# Patient Record
Sex: Male | Born: 1955 | Race: White | Hispanic: No | Smoking: Former smoker
Health system: Southern US, Community
[De-identification: ages and names within clinical notes are randomized; demographics above are authoritative.]

## PROBLEM LIST (undated history)

## (undated) DIAGNOSIS — K219 Gastro-esophageal reflux disease without esophagitis: Secondary | ICD-10-CM

## (undated) DIAGNOSIS — R0600 Dyspnea, unspecified: Secondary | ICD-10-CM

## (undated) DIAGNOSIS — I739 Peripheral vascular disease, unspecified: Secondary | ICD-10-CM

## (undated) DIAGNOSIS — I10 Essential (primary) hypertension: Secondary | ICD-10-CM

## (undated) DIAGNOSIS — J45909 Unspecified asthma, uncomplicated: Secondary | ICD-10-CM

## (undated) DIAGNOSIS — R071 Chest pain on breathing: Secondary | ICD-10-CM

## (undated) HISTORY — PX: HERNIA REPAIR: SHX51

## (undated) HISTORY — DX: Gastro-esophageal reflux disease without esophagitis: K21.9

## (undated) HISTORY — DX: Essential (primary) hypertension: I10

---

## 2002-02-15 DIAGNOSIS — R0789 Other chest pain: Secondary | ICD-10-CM

## 2002-02-15 HISTORY — DX: Other chest pain: R07.89

## 2004-02-29 ENCOUNTER — Emergency Department (HOSPITAL_COMMUNITY): Admission: EM | Admit: 2004-02-29 | Discharge: 2004-02-29 | Payer: Self-pay | Admitting: Emergency Medicine

## 2004-12-05 ENCOUNTER — Ambulatory Visit: Payer: Self-pay | Admitting: Internal Medicine

## 2005-03-15 ENCOUNTER — Emergency Department (HOSPITAL_COMMUNITY): Admission: EM | Admit: 2005-03-15 | Discharge: 2005-03-15 | Payer: Self-pay | Admitting: Family Medicine

## 2005-09-21 ENCOUNTER — Ambulatory Visit: Payer: Self-pay | Admitting: Internal Medicine

## 2005-12-07 ENCOUNTER — Ambulatory Visit: Payer: Self-pay | Admitting: Internal Medicine

## 2005-12-08 ENCOUNTER — Ambulatory Visit: Payer: Self-pay | Admitting: Internal Medicine

## 2006-04-26 ENCOUNTER — Ambulatory Visit: Payer: Self-pay | Admitting: Internal Medicine

## 2006-09-27 ENCOUNTER — Ambulatory Visit: Payer: Self-pay | Admitting: Internal Medicine

## 2006-10-14 ENCOUNTER — Ambulatory Visit: Payer: Self-pay | Admitting: Internal Medicine

## 2006-10-18 ENCOUNTER — Encounter: Admission: RE | Admit: 2006-10-18 | Discharge: 2006-10-18 | Payer: Self-pay | Admitting: General Surgery

## 2006-10-20 ENCOUNTER — Ambulatory Visit (HOSPITAL_BASED_OUTPATIENT_CLINIC_OR_DEPARTMENT_OTHER): Admission: RE | Admit: 2006-10-20 | Discharge: 2006-10-20 | Payer: Self-pay | Admitting: General Surgery

## 2008-01-25 ENCOUNTER — Ambulatory Visit: Payer: Self-pay | Admitting: Family Medicine

## 2008-01-25 DIAGNOSIS — J111 Influenza due to unidentified influenza virus with other respiratory manifestations: Secondary | ICD-10-CM

## 2008-01-30 ENCOUNTER — Encounter: Payer: Self-pay | Admitting: Internal Medicine

## 2008-01-30 ENCOUNTER — Ambulatory Visit: Payer: Self-pay | Admitting: Internal Medicine

## 2009-05-19 ENCOUNTER — Emergency Department (HOSPITAL_COMMUNITY): Admission: EM | Admit: 2009-05-19 | Discharge: 2009-05-19 | Payer: Self-pay | Admitting: Family Medicine

## 2010-06-17 NOTE — Assessment & Plan Note (Signed)
Summary: continued fever and cough/dm    History of Present Illness: 55 year old gentleman, who was seen 5 days ago with chest congestion and cough.  He has failed to improve with persistent cough and has developed posterior back pain.  he has no fever, and cough remains largely nonproductive.  Back pain is aggravated by the coughing    Current Allergies: No known allergies       Physical Exam  General:     alert.  no acute distress, but does have some mild resting tachypnea, and frequent paroxysms of coughing Head:     Normocephalic and atraumatic without obvious abnormalities. No apparent alopecia or balding. Eyes:     No corneal or conjunctival inflammation noted. EOMI. Perrla. Funduscopic exam benign, without hemorrhages, exudates or papilledema. Vision grossly normal. Ears:     External ear exam shows no significant lesions or deformities.  Otoscopic examination reveals clear canals, tympanic membranes are intact bilaterally without bulging, retraction, inflammation or discharge. Hearing is grossly normal bilaterally. Mouth:     Oral mucosa and oropharynx without lesions or exudates.  Teeth in good repair. Neck:     No deformities, masses, or tenderness noted. Lungs:     few rhonchi noted, especially the upper lungs; faint wheezing also noted; O2 saturation 99% Heart:     regular rhythm. S1 and S2 normal without gallop, murmur, click, rub or other extra sounds.  pulse rate 110 Abdomen:     Bowel sounds positive,abdomen soft and non-tender without masses, organomegaly or hernias noted.    Impression & Recommendations:  Problem # 1:  BRONCHITIS-ACUTE (ICD-466.0)  His updated medication list for this problem includes:    Hydrocodone-homatropine 5-1.5 Mg/30ml Syrp (Hydrocodone-homatropine) .Marland Kitchen... 2 tsp q4h as needed cough    Mucinex D [phone] Mg Xr12h-tab (Pseudoephedrine-guaifenesin) .Marland Kitchen... 1 two times a day for congestion    Doxycycline Hyclate 100 Mg Caps (Doxycycline  hyclate) ..... One twice daily patient has acute asthmatic bronchitis.  Will place on inhalational bronchodilators,  check a chest x-ray and placed on doxycycline 1 mg b.i.d. Orders: T-2 View CXR, Same Day ([id number])   Complete Medication List: 1)  Lorcet 10/650 10-650 Mg Tabs (Hydrocodone-acetaminophen) .Marland Kitchen.. 1 q4h as needed achin or pain 2)  Hydrocodone-homatropine 5-1.5 Mg/9ml Syrp (Hydrocodone-homatropine) .... 2 tsp q4h as needed cough 3)  Mucinex D [phone] Mg Xr12h-tab (Pseudoephedrine-guaifenesin) .Marland Kitchen.. 1 two times a day for congestion 4)  Doxycycline Hyclate 100 Mg Caps (Doxycycline hyclate) .... One twice daily   Patient Instructions: 1)  Drink as much fluid as you can tolerate for the next few days. 2)  chest x-ray, as scheduled   Prescriptions: DOXYCYCLINE HYCLATE 100 MG CAPS (DOXYCYCLINE HYCLATE) one twice daily  #14 x 0   Entered and Authorized by:   Gordy Savers  MD   Signed by:   Gordy Savers  MD on 01/30/2008   Method used:   Print then Give to Patient   RxID:   [id number]  ]

## 2010-09-30 NOTE — Op Note (Signed)
Martin, Soto NO.:  [id number]   MEDICAL RECORD NO.:  [id number]          PATIENT TYPE:  AMB   LOCATION:  [facility]                         FACILITY:  [facility]   PHYSICIAN:  Anselm Pancoast. Weatherly, M.D.DATE OF BIRTH:  [birth year]   DATE OF PROCEDURE:  10/20/2006  DATE OF DISCHARGE:                               OPERATIVE REPORT   PREOPERATIVE DIAGNOSIS:  Left inguinal hernia, probably direct.   POSTOPERATIVE DIAGNOSIS:  Left inguinal hernia, direct.   OPERATION:  Left inguinal herniorrhaphy.   ANESTHESIA:  General, with local applied to the wound.   SURGEON:  Anselm Pancoast. Zachery Dakins, M.D.   HISTORY:  The patient is a 55 year old male who I saw in the office  recently, when he presented with a left inguinal hernia.  His past  history is significant in that Dr. Samuella Cota repaired a right inguinal  hernia, I think it was in 2001 or 2002.  This patient has an obvious  bulge and I recommend that we repair this with mesh reinforcements  similar to what Dr. Samuella Cota had done on the right.  He is a cigarette  smoker and we did an EKG, which is unremarkable.  He said he was maybe  allergic to penicillin.  I gave him a gram of Ancef since he thinks he  can take the Kefzol, and he tolerated this without any problems.  The  patient was taken back to the operative suite.  The left side had been  marked and, after induction of general anesthesia and an LOA tube, the  left groin was first clipped and then prepped with Betadine solution and  draped in a sterile manner.  The inguinal incision area similar to Dr.  Kandice Hams side was infiltrated with a mixture of 0.5% Marcaine and 0.25%  Xylocaine with adrenaline, and the ilioinguinal nerve area was  infiltrated with a blunted 22-gauge needle, all total about 30 mL of  solution used.  Sharp dissection down through the skin, subcutaneous  tissue, and 2 large veins were clamped, divided, and ligated with fine  Vicryl, and then the external  oblique aponeurosis was opened through the  external ring.  The ilioinguinal nerve was identified and protected, and  then elevated, all structures with a Penrose.  This is a direct hernia  that is protruding out, and I reduced it, freed up the areolar tissue  around it, and then repaired the defect with a common modified shouldice  starting at the pubis and then closing the defect with a running 2-0  Prolene, and then going back and tying the two ends together with  running suture.  The Prolene mesh shaped like a sail was slipped and  placed under the cord structures.  The ilioinguinal nerve comes out with  the cord structures, and then I sutured it to the inguinal ligament with  a running 2-0 Prolene.  The two tails were sutured together laterally  and I was taking care not to compromise the new ring.  The superior flap  was sutured down with interrupted sutures of 2-0 Prolene.  The mesh was  lying flat  under no excessive tension.  I placed a few more mL of  Marcaine where I had repaired the floor, and then also put a little more  in the subcutaneous wound area.  The Scarpa fascia was closed with a  running 2-0 Vicryl.  The external oblique was closed with a running 2-0  Vicryl.  The Scarpa fascia was closed with an interrupted 3-0 Vicryl,  and then  a 4-0 Dexon subcuticular and benzoin and Steri-Strips on the skin.  The  testicle was in its normal position, and a sterile occlusive dressing  was applied.  The patient will be released after a short stay in the  recovery room, have Tylox for incisional pain, and I will see him in the  office in approximately 10 days.           ______________________________  Anselm Pancoast. Zachery Dakins, M.D.     WJW/MEDQ  D:  10/20/2006  T:  10/20/2006  Job:  [id number]   cc:   Gordy Savers, MD  [address] [place]  [place] [address]   Medical Chart

## 2011-03-05 LAB — COMPREHENSIVE METABOLIC PANEL
ALT: 24
AST: 21
Albumin: 4
Alkaline Phosphatase: 73
BUN: 9
CO2: 22
Calcium: 9.1
Chloride: 105
Creatinine, Ser: 0.83
GFR calc Af Amer: >60
GFR calc non Af Amer: >60
Glucose, Bld: 95
Potassium: 4
Sodium: 137
Total Bilirubin: 0.9
Total Protein: 6.1

## 2011-03-05 LAB — DIFFERENTIAL
Basophils Absolute: 0.1
Basophils Relative: 1
Eosinophils Absolute: 0.1
Eosinophils Relative: 1
Lymphocytes Relative: 43
Lymphs Abs: 5.4 — ABNORMAL HIGH
Monocytes Absolute: 0.5
Monocytes Relative: 4
Neutro Abs: 6.5
Neutrophils Relative %: 52

## 2011-03-05 LAB — CBC
HCT: 46.2
Hemoglobin: 16.2
MCHC: 35
MCV: 92.7
Platelets: 195
RBC: 4.98
RDW: 12.9
WBC: 12.5 — ABNORMAL HIGH

## 2011-05-10 ENCOUNTER — Emergency Department (INDEPENDENT_AMBULATORY_CARE_PROVIDER_SITE_OTHER)
Admission: EM | Admit: 2011-05-10 | Discharge: 2011-05-10 | Disposition: A | Discharge status: Home / Self Care | Payer: BC Managed Care – PPO | Source: Home / Self Care | Attending: Family Medicine | Admitting: Family Medicine

## 2011-05-10 ENCOUNTER — Emergency Department (INDEPENDENT_AMBULATORY_CARE_PROVIDER_SITE_OTHER): Payer: BC Managed Care – PPO

## 2011-05-10 DIAGNOSIS — R6889 Other general symptoms and signs: Secondary | ICD-10-CM

## 2011-05-10 DIAGNOSIS — J111 Influenza due to unidentified influenza virus with other respiratory manifestations: Secondary | ICD-10-CM

## 2011-05-10 MED ORDER — HYDROCOD POLST-CHLORPHEN POLST 10-8 MG/5ML PO LQCR: 5.0000 mL | Freq: Two times a day (BID) | ORAL | Status: DC | PRN

## 2011-05-10 NOTE — ED Notes (Signed)
Patient complains of cough, chills, sore throat, headache and bilateral rib pain when coughing.  Patient states symptoms started yesterday am.

## 2011-05-10 NOTE — ED Provider Notes (Signed)
History     CSN: [id number]  Arrival date & time 05/10/11  1518   First MD Initiated Contact with Patient 05/10/11 1538      Chief Complaint  Patient presents with  . Cough  . Chills    (Consider location/radiation/quality/duration/timing/severity/associated sxs/prior treatment) HPI Comments: Martin Soto presents for evaluation of sudden onset of body aches, persistent non-productive cough, rib pain with cough.  Patient is a 55 y.o. male presenting with cough. The history is provided by the patient.  Cough This is a new problem. The current episode started yesterday. The problem occurs constantly. The problem has not changed since onset.The cough is non-productive. The maximum temperature recorded prior to his arrival was 101 to 101.9 F. Associated symptoms include chest pain, chills, rhinorrhea, sore throat, myalgias and shortness of breath. He is a smoker.    History reviewed. No pertinent past medical history.  Past Surgical History  Procedure Date  . Hernia repair     History reviewed. No pertinent family history.  History  Substance Use Topics  . Smoking status: Current Everyday Smoker -- 1.0 packs/day    Types: Cigarettes  . Smokeless tobacco: Not on file  . Alcohol Use: Yes     social      Review of Systems  Constitutional: Positive for fever and chills.  HENT: Positive for congestion, sore throat and rhinorrhea.   Eyes: Negative.   Respiratory: Positive for cough and shortness of breath.   Cardiovascular: Positive for chest pain.  Gastrointestinal: Negative.   Genitourinary: Negative.   Musculoskeletal: Positive for myalgias.  Skin: Negative.   Neurological: Negative.     Allergies  Penicillins  Home Medications   Current Outpatient Rx  Name Route Sig Dispense Refill  . HYDROCOD POLST-CHLORPHEN POLST 10-8 MG/5ML PO LQCR Oral Take 5 mLs by mouth every 12 (twelve) hours as needed (May take 5 ml to 10 ml every 12 hours as needed). 140 mL 0    BP 128/84   Pulse 99  Temp(Src) 99.1 F (37.3 C) (Oral)  Resp 20  SpO2 100%  Physical Exam  Constitutional: He is oriented to person, place, and time. He appears well-developed and well-nourished.  HENT:  Head: Normocephalic and atraumatic.  Right Ear: Tympanic membrane and external ear normal.  Left Ear: Tympanic membrane and external ear normal.  Mouth/Throat: Uvula is midline, oropharynx is clear and moist and mucous membranes are normal. No oropharyngeal exudate, posterior oropharyngeal edema or posterior oropharyngeal erythema.  Eyes: Conjunctivae and EOM are normal. Pupils are equal, round, and reactive to light.  Neck: Normal range of motion. Neck supple.  Cardiovascular: Normal rate and regular rhythm.   Pulmonary/Chest: Effort normal and breath sounds normal. He has no wheezes. He has no rhonchi. He has no rales.  Neurological: He is alert and oriented to person, place, and time.  Skin: Skin is warm and dry.    ED Course  Procedures (including critical care time)  Labs Reviewed - No data to display Dg Chest 2 View  05/10/2011  *RADIOLOGY REPORT*  Clinical Data: Cough and fever.  CHEST - 2 VIEW  Comparison: Plain films of the chest 01/30/2008.  Findings: No focal airspace disease or effusion is identified. There is some peribronchial thickening which is stable in appearance.  Heart size is upper normal.  IMPRESSION: Chronic bronchitic change.  No acute finding.  Original Report Authenticated By: Bernadene Bell. D'ALESSIO, M.D.     1. Influenza-like illness       MDM  Given  rx for Tussionex Pennkinetic cough syrup; supportive care        Richardo Priest, MD 05/10/11 1731

## 2013-03-21 ENCOUNTER — Telehealth: Payer: Self-pay | Admitting: Internal Medicine

## 2013-03-21 NOTE — Telephone Encounter (Signed)
Patient Information:  Caller Name: Edwar  Phone: [phone]  Patient: Martin Soto, Martin Soto  Gender: Male  DOB: Jun 20, 1955  Age: 57 Years  PCP: Eleonore Chiquito (Family Practice > 57yrs old)  Office Follow Up:  Does the office need to follow up with this patient?: Yes  Instructions For The Office: appt to re-establish care krs/can  RN Note:  States went on vacation with a group last week, which included an RN who "checked everybody's blood pressure twice a day."  States she kept telling him that his heart rate was very high, and his blood pressure was "a little high," and he should have it checked out.  States he "has been hyper all my life."  HR 92-113 during relaxed state.  States BP was 150's/100's like 159/108.  States he plays golf regularly and feels well.  Per high blood pressure protocol, emergent symptoms currently denied; advised appt within 2 weeks.  Appt scheduled 03/22/13 0915.  Patient states he has not been seen in office for > 3 years; TC to office.  Per staff request, caller advised he is considered a new patient due to not being seen > 3 years; appt cancelled in Epic.  Info to office for staff/provider review/callback to schedule appt to re-establish care.  krs/can  Symptoms  Reason For Call & Symptoms: high blood pressure  Reviewed Health History In EMR: Yes  Reviewed Medications In EMR: Yes  Reviewed Allergies In EMR: Yes  Reviewed Surgeries / Procedures: Yes  Date of Onset of Symptoms: 03/21/2013  Guideline(s) Used:  High Blood Pressure  Disposition Per Guideline:   See Within 2 Weeks in Office  Reason For Disposition Reached:   BP > 140/90 and is not taking BP medications  Advice Given:  N/A  Patient Will Follow Care Advice:  YES

## 2013-03-21 NOTE — Telephone Encounter (Signed)
Please call pt and schedule an appt regarding blood pressure.

## 2013-03-21 NOTE — Telephone Encounter (Signed)
Pt is sch for tomorrow °

## 2013-03-22 ENCOUNTER — Encounter: Payer: Self-pay | Admitting: Internal Medicine

## 2013-03-22 ENCOUNTER — Ambulatory Visit (INDEPENDENT_AMBULATORY_CARE_PROVIDER_SITE_OTHER): Payer: 59 | Admitting: Internal Medicine

## 2013-03-22 ENCOUNTER — Ambulatory Visit: Payer: Self-pay | Admitting: Internal Medicine

## 2013-03-22 VITALS — BP 150/90 | HR 98 | Temp 98.3°F | Resp 20 | Wt 162.0 lb

## 2013-03-22 DIAGNOSIS — I10 Essential (primary) hypertension: Secondary | ICD-10-CM

## 2013-03-22 MED ORDER — LISINOPRIL-HYDROCHLOROTHIAZIDE 20-25 MG PO TABS: 1.0000 | ORAL_TABLET | Freq: Every day | ORAL | Status: DC

## 2013-03-22 NOTE — Patient Instructions (Signed)
Limit your sodium (Salt) intake  Please check your blood pressure on a regular basis.  If it is consistently greater than 150/90, please make an office appointment.    It is important that you exercise regularly, at least 20 minutes 3 to 4 times per week.  If you develop chest pain or shortness of breath seek  medical attention.DASH Diet The DASH diet stands for "Dietary Approaches to Stop Hypertension." It is a healthy eating plan that has been shown to reduce high blood pressure (hypertension) in as little as 14 days, while also possibly providing other significant health benefits. These other health benefits include reducing the risk of breast cancer after menopause and reducing the risk of type 2 diabetes, heart disease, colon cancer, and stroke. Health benefits also include weight loss and slowing kidney failure in patients with chronic kidney disease.  DIET GUIDELINES  Limit salt (sodium). Your diet should contain less than 1500 mg of sodium daily.  Limit refined or processed carbohydrates. Your diet should include mostly whole grains. Desserts and added sugars should be used sparingly.  Include small amounts of heart-healthy fats. These types of fats include nuts, oils, and tub margarine. Limit saturated and trans fats. These fats have been shown to be harmful in the body. CHOOSING FOODS  The following food groups are based on a 2000 calorie diet. See your Registered Dietitian for individual calorie needs. Grains and Grain Products (6 to 8 servings daily)  Eat More Often: Whole-wheat bread, brown rice, whole-grain or wheat pasta, quinoa, popcorn without added fat or salt (air popped).  Eat Less Often: White bread, white pasta, white rice, cornbread. Vegetables (4 to 5 servings daily)  Eat More Often: Fresh, frozen, and canned vegetables. Vegetables may be raw, steamed, roasted, or grilled with a minimal amount of fat.  Eat Less Often/Avoid: Creamed or fried vegetables. Vegetables in a  cheese sauce. Fruit (4 to 5 servings daily)  Eat More Often: All fresh, canned (in natural juice), or frozen fruits. Dried fruits without added sugar. One hundred percent fruit juice ( cup [237 mL] daily).  Eat Less Often: Dried fruits with added sugar. Canned fruit in light or heavy syrup. [employer], Fish, and Poultry (2 servings or less daily. One serving is 3 to 4 oz [85-114 g]).  Eat More Often: Ninety percent or leaner ground beef, tenderloin, sirloin. Round cuts of beef, chicken breast, [place] breast. All fish. Grill, bake, or broil your meat. Nothing should be fried.  Eat Less Often/Avoid: Fatty cuts of meat, [place], or chicken leg, thigh, or wing. Fried cuts of meat or fish. Dairy (2 to 3 servings)  Eat More Often: Low-fat or fat-free milk, low-fat plain or light yogurt, reduced-fat or part-skim cheese.  Eat Less Often/Avoid: Milk (whole, 2%).Whole milk yogurt. Full-fat cheeses. Nuts, Seeds, and Legumes (4 to 5 servings per week)  Eat More Often: All without added salt.  Eat Less Often/Avoid: Salted nuts and seeds, canned beans with added salt. Fats and Sweets (limited)  Eat More Often: Vegetable oils, tub margarines without trans fats, sugar-free gelatin. Mayonnaise and salad dressings.  Eat Less Often/Avoid: Coconut oils, palm oils, butter, stick margarine, cream, half and half, cookies, candy, pie. FOR MORE INFORMATION The Dash Diet Eating Plan: www.dashdiet.org Document Released: 04/23/2011 Document Revised: 07/27/2011 Document Reviewed: 04/23/2011 [facility] Patient Information 2014 [place], [place]. Hypertension As your heart beats, it forces blood through your arteries. This force is your blood pressure. If the pressure is too high, it is called hypertension (  HTN) or high blood pressure. HTN is dangerous because you may have it and not know it. High blood pressure may mean that your heart has to work harder to pump blood. Your arteries may be narrow or stiff. The extra  work puts you at risk for heart disease, stroke, and other problems.  Blood pressure consists of two numbers, a higher number over a lower, 110/72, for example. It is stated as "110 over 72." The ideal is below 120 for the top number (systolic) and under 80 for the bottom (diastolic). Write down your blood pressure today. You should pay close attention to your blood pressure if you have certain conditions such as:  Heart failure.  Prior heart attack.  Diabetes  Chronic kidney disease.  Prior stroke.  Multiple risk factors for heart disease. To see if you have HTN, your blood pressure should be measured while you are seated with your arm held at the level of the heart. It should be measured at least twice. A one-time elevated blood pressure reading (especially in the Emergency Department) does not mean that you need treatment. There may be conditions in which the blood pressure is different between your right and left arms. It is important to see your caregiver soon for a recheck. Most people have essential hypertension which means that there is not a specific cause. This type of high blood pressure may be lowered by changing lifestyle factors such as:  Stress.  Smoking.  Lack of exercise.  Excessive weight.  Drug/tobacco/alcohol use.  Eating less salt. Most people do not have symptoms from high blood pressure until it has caused damage to the body. Effective treatment can often prevent, delay or reduce that damage. TREATMENT  When a cause has been identified, treatment for high blood pressure is directed at the cause. There are a large number of medications to treat HTN. These fall into several categories, and your caregiver will help you select the medicines that are best for you. Medications may have side effects. You should review side effects with your caregiver. If your blood pressure stays high after you have made lifestyle changes or started on medicines,   Your medication(s)  may need to be changed.  Other problems may need to be addressed.  Be certain you understand your prescriptions, and know how and when to take your medicine.  Be sure to follow up with your caregiver within the time frame advised (usually within two weeks) to have your blood pressure rechecked and to review your medications.  If you are taking more than one medicine to lower your blood pressure, make sure you know how and at what times they should be taken. Taking two medicines at the same time can result in blood pressure that is too low. SEEK IMMEDIATE MEDICAL CARE IF:  You develop a severe headache, blurred or changing vision, or confusion.  You have unusual weakness or numbness, or a faint feeling.  You have severe chest or abdominal pain, vomiting, or breathing problems. MAKE SURE YOU:   Understand these instructions.  Will watch your condition.  Will get help right away if you are not doing well or get worse. Document Released: 05/04/2005 Document Revised: 07/27/2011 Document Reviewed: 12/23/2007 [facility] Patient Information 2014 [place], [place].

## 2013-03-22 NOTE — Progress Notes (Signed)
  Subjective:    Patient ID: Martin Soto, male    DOB: Jun 19, 1955, 57 y.o.   MRN: [id number]  HPI  57 year old patient who has not been seen here in a number of years. No prior history of hypertension. He was on vacation recently and a friend who is an Char[employer] fundraiser took his blood pressure frequently with consistently high readings. He feels well and exercises regularly. No cardiopulmonary complaints. Nonsmoker    Review of Systems  Constitutional: Negative for fever, chills, appetite change and fatigue.  HENT: Negative for congestion, dental problem, ear pain, hearing loss, sore throat, tinnitus, trouble swallowing and voice change.   Eyes: Negative for pain, discharge and visual disturbance.  Respiratory: Negative for cough, chest tightness, wheezing and stridor.   Cardiovascular: Negative for chest pain, palpitations and leg swelling.  Gastrointestinal: Negative for nausea, vomiting, abdominal pain, diarrhea, constipation, blood in stool and abdominal distention.  Genitourinary: Negative for urgency, hematuria, flank pain, discharge, difficulty urinating and genital sores.  Musculoskeletal: Negative for arthralgias, back pain, gait problem, joint swelling, myalgias and neck stiffness.  Skin: Negative for rash.  Neurological: Negative for dizziness, syncope, speech difficulty, weakness, numbness and headaches.  Hematological: Negative for adenopathy. Does not bruise/bleed easily.  Psychiatric/Behavioral: Negative for behavioral problems and dysphoric mood. The patient is not nervous/anxious.        Objective:   Physical Exam  Constitutional: He is oriented to person, place, and time. He appears well-developed.  Blood pressure 160/90 to 96  HENT:  Head: Normocephalic.  Right Ear: External ear normal.  Left Ear: External ear normal.  Eyes: Conjunctivae and EOM are normal.  Neck: Normal range of motion.  Cardiovascular: Normal rate and normal heart sounds.   Pulmonary/Chest: Breath sounds  normal.  Abdominal: Bowel sounds are normal.  Musculoskeletal: Normal range of motion. He exhibits no edema and no tenderness.  Neurological: He is alert and oriented to person, place, and time.  Psychiatric: He has a normal mood and affect. His behavior is normal.          Assessment & Plan:   Hypertension. Patient has had multiple recent blood pressure readings all in the mildly elevated range. We'll place on a DASH diet and begin treatment. Home blood pressure monitor and encouraged. We'll see for an annual exam in 3 months

## 2013-03-27 ENCOUNTER — Encounter: Payer: Self-pay | Admitting: Internal Medicine

## 2013-03-27 ENCOUNTER — Ambulatory Visit (INDEPENDENT_AMBULATORY_CARE_PROVIDER_SITE_OTHER): Payer: 59 | Admitting: Internal Medicine

## 2013-03-27 VITALS — BP 122/90 | HR 111 | Temp 98.6°F | Resp 20 | Wt 165.0 lb

## 2013-03-27 DIAGNOSIS — I10 Essential (primary) hypertension: Secondary | ICD-10-CM

## 2013-03-27 MED ORDER — AMLODIPINE BESYLATE 2.5 MG PO TABS: 2.5000 mg | ORAL_TABLET | Freq: Every day | ORAL | Status: DC

## 2013-03-27 NOTE — Patient Instructions (Signed)
Limit your sodium (Salt) intake  Please check your blood pressure on a regular basis.  If it is consistently greater than 150/90, start taking amlodipine.  Return in 3 months for follow-up

## 2013-03-27 NOTE — Progress Notes (Signed)
Subjective:    Patient ID: Martin Soto, male    DOB: 03/29/1956, 57 y.o.   MRN: [id number]  HPI Pre-visit discussion using our clinic review tool. No additional management support is needed unless otherwise documented below in the visit note.  57 year old patient was seen 5 days ago and placed on combination therapy for sustained hypertension. Throughout the weekend he has done poorly with nausea headache dizziness and general sense of unwellness. He has not obtained a home blood pressure monitor to track his blood pressure readings. He discontinued medication earlier today and presently feels well  History reviewed. No pertinent past medical history.  History   Social History  . Marital Status: Divorced    Spouse Name: N/A    Number of Children: N/A  . Years of Education: N/A   Occupational History  . Not on file.   Social History Main Topics  . Smoking status: Current Every Day Smoker -- 1.00 packs/day    Types: Cigarettes  . Smokeless tobacco: Not on file  . Alcohol Use: Yes     Comment: social  . Drug Use: No  . Sexual Activity:    Other Topics Concern  . Not on file   Social History Narrative  . No narrative on file    Past Surgical History  Procedure Laterality Date  . Hernia repair      No family history on file.  Allergies  Allergen Reactions  . Lisinopril-Hydrochlorothiazide Nausea And Vomiting    Headache, dizzy, blurred vision, toes numb  . Penicillins     Current Outpatient Prescriptions on File Prior to Visit  Medication Sig Dispense Refill  . Ibuprofen-Diphenhydramine Cit (ADVIL PM PO) Take 1 tablet by mouth at bedtime.      . naproxen sodium (ALEVE) 220 MG tablet Take 220 mg by mouth daily.       No current facility-administered medications on file prior to visit.    BP 122/90  Pulse 111  Temp(Src) 98.6 F (37 C) (Oral)  Resp 20  Wt 165 lb (74.844 kg)  SpO2 97%    Review of Systems  Constitutional: Negative for fever, chills,  appetite change and fatigue.  HENT: Negative for congestion, dental problem, ear pain, hearing loss, sore throat, tinnitus, trouble swallowing and voice change.   Eyes: Negative for pain, discharge and visual disturbance.  Respiratory: Negative for cough, chest tightness, wheezing and stridor.   Cardiovascular: Negative for chest pain, palpitations and leg swelling.  Gastrointestinal: Positive for nausea. Negative for vomiting, abdominal pain, diarrhea, constipation, blood in stool and abdominal distention.  Genitourinary: Negative for urgency, hematuria, flank pain, discharge, difficulty urinating and genital sores.  Musculoskeletal: Negative for arthralgias, back pain, gait problem, joint swelling, myalgias and neck stiffness.  Skin: Negative for rash.  Neurological: Positive for light-headedness, numbness and headaches. Negative for dizziness, syncope, speech difficulty and weakness.  Hematological: Negative for adenopathy. Does not bruise/bleed easily.  Psychiatric/Behavioral: Negative for behavioral problems and dysphoric mood. The patient is not nervous/anxious.        Objective:   Physical Exam  Constitutional: He appears well-developed and well-nourished. No distress.  Blood pressure 120/82          Assessment & Plan:   History of hypertension.  ADE unclear whether this was a true drug reaction or perhaps symptoms secondary to hypotension.  Home blood pressure monitor and encouraged. Will hold therapy at this time unless blood pressure documented greater than 150/90. Will start amlodipine 2.5 mg daily if blood  pressure is confirmed to be high

## 2017-06-29 ENCOUNTER — Ambulatory Visit: Payer: Self-pay | Admitting: *Deleted

## 2017-06-29 NOTE — Telephone Encounter (Signed)
Patient states he felt "pop" when he was coughing Saturday. He has R rib pain that is severe when he coughs. He states he is using NSAID without relief. Call to PCP- no appointment available- UC advised.  Reason for Disposition . [1] MODERATE pain (e.g., interferes with normal activities) AND [2] pain is WORSE with breathing  Answer Assessment - Initial Assessment Questions 1. MECHANISM: "How did the injury happen?"     Sneezing- heard pop- Saturday 2. ONSET: "When did the injury happen?" (e.g., minutes, hours, days ago)     Saturday 3. LOCATION: "Where on the chest is the injury located?" "Where does it hurt?"     Right rib cage in the back- last rib 4. CHEST OR RIB PAIN SEVERITY: "How bad is the pain?"  (e.g., Scale 1-10; mild, moderate, or severe)    - MILD (1-3): doesn't interfere with normal activities     - MODERATE (4-7): interferes with normal activities or awakens from sleep    - SEVERE (8-10): excruciating pain, unable to do any normal activities       3 when coughs it is 10 5. BREATHING DIFFICULTY: "Are you having any difficulty breathing?" If so, ask "How bad is it?"  (e.g., none, mild, moderate, severe)  Painful- but not a big deal 6: OTHER SYMPTOMS (e.g., cough, fever, rash)      Cough- is painful- patient sees starts 7. PREGNANCY: "Is there any chance you are pregnant?" "When was your last menstrual period?"     n/a  Protocols used: CHEST INJURY - BENDING, LIFTING, OR TWISTING-A-AH

## 2018-03-29 ENCOUNTER — Ambulatory Visit (INDEPENDENT_AMBULATORY_CARE_PROVIDER_SITE_OTHER)
Admission: RE | Admit: 2018-03-29 | Discharge: 2018-03-29 | Disposition: A | Discharge status: Home / Self Care | Payer: Managed Care, Other (non HMO) | Source: Ambulatory Visit | Attending: Internal Medicine | Admitting: Internal Medicine

## 2018-03-29 ENCOUNTER — Other Ambulatory Visit: Payer: Self-pay | Admitting: Internal Medicine

## 2018-03-29 ENCOUNTER — Ambulatory Visit: Payer: Managed Care, Other (non HMO) | Admitting: Internal Medicine

## 2018-03-29 ENCOUNTER — Encounter: Payer: Self-pay | Admitting: Internal Medicine

## 2018-03-29 VITALS — BP 142/94 | HR 102 | Temp 98.0°F | Ht 66.0 in | Wt 160.0 lb

## 2018-03-29 DIAGNOSIS — K219 Gastro-esophageal reflux disease without esophagitis: Secondary | ICD-10-CM | POA: Insufficient documentation

## 2018-03-29 DIAGNOSIS — I10 Essential (primary) hypertension: Secondary | ICD-10-CM | POA: Diagnosis not present

## 2018-03-29 DIAGNOSIS — R0781 Pleurodynia: Secondary | ICD-10-CM

## 2018-03-29 MED ORDER — PREDNISONE 10 MG PO TABS: ORAL_TABLET | ORAL | 0 refills | Status: DC

## 2018-03-29 NOTE — Progress Notes (Signed)
HPI:  Pt presents to the clinic today to establish care and for management of the conditions listed below. He is transferring care from Dr. Inda Merlin.  GERD: Triggered by spicy food. He reports he has had ulcers in the past. He denies breakthrough on Ranitidine. There is no upper GI on file.  HTN: His BP today is 142/94. He refuses to take antihypertensive medication due to bad reactions in the past. There is no ECG on file.   Pt presents to the clinic today with right side rib pain. He reports back in February, he sneezed and he felt something pop. He has been in intense pain since that time. He reports the pain is worse with pressure from the seatbelt, worse with sitting or standing for long periods of time. He does not have pain when he lays down. He reports associated fatigue, low back pain that radiates into the right leg. He has numbness and tingling but no weakness. He denies loss of bowel or bladder. He has tried Advil with minimal relief. He denies runny nose, cough, shortness of breath, nausea, vomiting, diarrhea.   Flu: never Tetanus: unsure Shingrix: never PSA Screening: never Colon Screening: never Vision Screening: annually Dentist: annually   Past Medical History:  Diagnosis Date  . GERD (gastroesophageal reflux disease)     Current Outpatient Medications  Medication Sig Dispense Refill  . Ibuprofen-Diphenhydramine Cit (ADVIL PM PO) Take 1 tablet by mouth at bedtime.    . naproxen sodium (ALEVE) 220 MG tablet Take 440 mg by mouth 3 (three) times daily.     . ranitidine (ZANTAC) 150 MG capsule Take 150 mg by mouth 2 (two) times daily.     No current facility-administered medications for this visit.     Allergies  Allergen Reactions  . Lisinopril-Hydrochlorothiazide Nausea And Vomiting    Headache, dizzy, blurred vision, toes numb  . Penicillins     Family History  Problem Relation Age of Onset  . Alzheimer's disease Mother   . Multiple myeloma Maternal Uncle    . Pancreatic cancer Paternal Uncle   . Multiple myeloma Maternal Grandmother   . Multiple myeloma Maternal Grandfather   . Pancreatic cancer Paternal Grandmother   . Heart attack Paternal Grandfather     Social History   Socioeconomic History  . Marital status: Divorced    Spouse name: Not on file  . Number of children: Not on file  . Years of education: Not on file  . Highest education level: Not on file  Occupational History  . Not on file  Social Needs  . Financial resource strain: Not on file  . Food insecurity:    Worry: Not on file    Inability: Not on file  . Transportation needs:    Medical: Not on file    Non-medical: Not on file  Tobacco Use  . Smoking status: Current Every Day Smoker    Packs/day: 1.00    Types: Cigarettes  . Smokeless tobacco: Never Used  Substance and Sexual Activity  . Alcohol use: Yes    Comment: social  . Drug use: No  . Sexual activity: Not on file  Lifestyle  . Physical activity:    Days per week: Not on file    Minutes per session: Not on file  . Stress: Not on file  Relationships  . Social connections:    Talks on phone: Not on file    Gets together: Not on file    Attends religious service: Not on  file    Active member of club or organization: Not on file    Attends meetings of clubs or organizations: Not on file    Relationship status: Not on file  . Intimate partner violence:    Fear of current or ex partner: Not on file    Emotionally abused: Not on file    Physically abused: Not on file    Forced sexual activity: Not on file  Other Topics Concern  . Not on file  Social History Narrative  . Not on file    ROS:  Constitutional: Denies fever, malaise, fatigue, headache or abrupt weight changes.  HEENT: Denies eye pain, eye redness, ear pain, ringing in the ears, wax buildup, runny nose, nasal congestion, bloody nose, or sore throat. Respiratory: Denies difficulty breathing, shortness of breath, cough or sputum  production.   Cardiovascular: Denies chest pain, chest tightness, palpitations or swelling in the hands or feet.  Gastrointestinal: Pt reports reflux. Denies abdominal pain, bloating, constipation, diarrhea or blood in the stool.  GU: Denies frequency, urgency, pain with urination, blood in urine, odor or discharge. Musculoskeletal: Pt reports right side rib pain. Denies decrease in range of motion, difficulty with gait, or joint swelling.  Skin: Denies redness, rashes, lesions or ulcercations.  Neurological: Denies dizziness, difficulty with memory, difficulty with speech or problems with balance and coordination.  Psych: Denies anxiety, depression, SI/HI.  No other specific complaints in a complete review of systems (except as listed in HPI above).  PE:  BP (!) 142/94   Pulse (!) 102   Temp 98 F (36.7 C) (Oral)   Ht _0  (1.676 m)   Wt 160 lb (72.6 kg)   SpO2 98%   BMI 25.82 kg/m  Wt Readings from Last 3 Encounters:  03/29/18 160 lb (72.6 kg)  03/27/13 165 lb (74.8 kg)  03/22/13 162 lb (73.5 kg)    General: Appears his stated age, well developed, well nourished in NAD. Skin: Dry and intact. No rash noted. Cardiovascular: Normal rate and rhythm. S1,S2 noted.  No murmur, rubs or gallops noted.  Pulmonary/Chest: Normal effort and positive vesicular breath sounds. No respiratory distress. No wheezes, rales or ronchi noted.  Abdomen: Soft and nontender. Normal bowel sounds. Musculoskeletal: Right lateral ribs tender with palpation. Neurological: Alert and oriented.  Psychiatric: Mood and affect normal. Behavior is normal. Judgment and thought content normal.   BMET    Component Value Date/Time   NA 137 10/18/2006 1442   K 4.0 10/18/2006 1442   CL 105 10/18/2006 1442   CO2 22 10/18/2006 1442   GLUCOSE 95 10/18/2006 1442   BUN 9 10/18/2006 1442   CREATININE 0.83 10/18/2006 1442   CALCIUM 9.1 10/18/2006 1442   GFRNONAA >60 10/18/2006 1442   GFRAA  10/18/2006 1442    >60         The eGFR has been calculated using the MDRD equation. This calculation has not been validated in all clinical    Lipid Panel  No results found for: CHOL, TRIG, HDL, CHOLHDL, VLDL, LDLCALC  CBC    Component Value Date/Time   WBC 12.5 (H) 10/18/2006 1442   RBC 4.98 10/18/2006 1442   HGB 16.2 10/18/2006 1442   HCT 46.2 10/18/2006 1442   PLT 195 10/18/2006 1442   MCV 92.7 10/18/2006 1442   MCHC 35.0 10/18/2006 1442   RDW 12.9 10/18/2006 1442   LYMPHSABS 5.4 (H) 10/18/2006 1442   MONOABS 0.5 10/18/2006 1442   EOSABS 0.1 10/18/2006 1442  BASOSABS 0.1 10/18/2006 1442    Hgb A1C No results found for: HGBA1C   Assessment and Plan:  Right Side Rib Pain:  Will obtain xray right ribs Failed NSAID's Consider RX for Prednisone for intercostal inflammation and irritation if xray normal  Will follow up after xray, return precautions discussed Webb Silversmith, NP

## 2018-03-29 NOTE — Progress Notes (Signed)
red 

## 2018-03-29 NOTE — Patient Instructions (Signed)
Chest Wall Pain °Chest wall pain is pain in or around the bones and muscles of your chest. Sometimes, an injury causes this pain. Sometimes, the cause may not be known. This pain may take several weeks or longer to get better. °Follow these instructions at home: °Pay attention to any changes in your symptoms. Take these actions to help with your pain: °· Rest as told by your doctor. °· Avoid activities that cause pain. Try not to use your chest, belly (abdominal), or side muscles to lift heavy things. °· If directed, apply ice to the painful area: °? Put ice in a plastic bag. °? Place a towel between your skin and the bag. °? Leave the ice on for 20 minutes, 2-3 times per day. °· Take over-the-counter and prescription medicines only as told by your doctor. °· Do not use tobacco products, including cigarettes, chewing tobacco, and e-cigarettes. If you need help quitting, ask your doctor. °· Keep all follow-up visits as told by your doctor. This is important. ° °Contact a doctor if: °· You have a fever. °· Your chest pain gets worse. °· You have new symptoms. °Get help right away if: °· You feel sick to your stomach (nauseous) or you throw up (vomit). °· You feel sweaty or light-headed. °· You have a cough with phlegm (sputum) or you cough up blood. °· You are short of breath. °This information is not intended to replace advice given to you by your health care provider. Make sure you discuss any questions you have with your health care provider. °Document Released: 10/21/2007 Document Revised: 10/10/2015 Document Reviewed: 07/30/2014 °Elsevier Interactive Patient Education © 2018 [employer] ° °

## 2018-03-29 NOTE — Assessment & Plan Note (Signed)
Uncontrolled off meds Refusing medication, understands risk of heart attack, stroke and death Reinforced DASH diet and exercise for weight loss

## 2018-03-29 NOTE — Assessment & Plan Note (Signed)
Stable on Ranitidine Encouraged him to avoid spicy foods Discussed how weight loss could help improve reflux

## 2018-04-21 ENCOUNTER — Ambulatory Visit (INDEPENDENT_AMBULATORY_CARE_PROVIDER_SITE_OTHER)
Admission: RE | Admit: 2018-04-21 | Discharge: 2018-04-21 | Disposition: A | Discharge status: Home / Self Care | Payer: Managed Care, Other (non HMO) | Source: Ambulatory Visit | Attending: Internal Medicine | Admitting: Internal Medicine

## 2018-04-21 ENCOUNTER — Ambulatory Visit: Payer: Managed Care, Other (non HMO) | Admitting: Internal Medicine

## 2018-04-21 ENCOUNTER — Encounter: Payer: Self-pay | Admitting: Internal Medicine

## 2018-04-21 VITALS — BP 132/82 | HR 114 | Temp 97.8°F | Wt 162.0 lb

## 2018-04-21 DIAGNOSIS — R1011 Right upper quadrant pain: Secondary | ICD-10-CM | POA: Diagnosis not present

## 2018-04-21 DIAGNOSIS — I739 Peripheral vascular disease, unspecified: Secondary | ICD-10-CM

## 2018-04-21 DIAGNOSIS — R0781 Pleurodynia: Secondary | ICD-10-CM | POA: Diagnosis not present

## 2018-04-21 DIAGNOSIS — R200 Anesthesia of skin: Secondary | ICD-10-CM

## 2018-04-21 DIAGNOSIS — F17200 Nicotine dependence, unspecified, uncomplicated: Secondary | ICD-10-CM

## 2018-04-21 DIAGNOSIS — M940 Chondrocostal junction syndrome [Tietze]: Secondary | ICD-10-CM

## 2018-04-21 NOTE — Progress Notes (Signed)
Subjective:    Patient ID: Martin Soto, male    DOB: 12-10-1955, 62 y.o.   MRN: [id number]  HPI  Pt presents to the clinic today to follow up persistent right side pain. He reports back in February, he sneezed and felt something pop. He reports the pain is worse with pressure from the seatbelt, worse with sitting or standing for long periods of time. He has no pain when he lays down. He reports right side low back pain that radiates into the right leg. He has numbness in the right leg but no tingling or weakness. He denis loss of bowel or bladder. He was seen for the same 11/12. Xray of the ribs was negative. He was treated with a 6 day Prednisone Taper. Since that time, he reports no improvement with Prednisone, and reports his pain actually feels worse. He feels something popping in the right side of his ribs. He reports persistent numbness of his right leg. He denies any injury to the area. He has taken Aleve with minimal relief.    Review of Systems      Past Medical History:  Diagnosis Date  . GERD (gastroesophageal reflux disease)     Current Outpatient Medications  Medication Sig Dispense Refill  . Ibuprofen-Diphenhydramine Cit (ADVIL PM PO) Take 1 tablet by mouth at bedtime.    . naproxen sodium (ALEVE) 220 MG tablet Take 440 mg by mouth 3 (three) times daily.     . predniSONE (DELTASONE) 10 MG tablet Take 6 tabs day 1, 5 tabs day 2, 4 tabs day 3, 3 tabs day 4, 2 tabs day 5, 1 tab day 6 21 tablet 0  . ranitidine (ZANTAC) 150 MG capsule Take 150 mg by mouth 2 (two) times daily.     No current facility-administered medications for this visit.     Allergies  Allergen Reactions  . Lisinopril-Hydrochlorothiazide Nausea And Vomiting    Headache, dizzy, blurred vision, toes numb  . Penicillins     Family History  Problem Relation Age of Onset  . Alzheimer's disease Mother   . Multiple myeloma Maternal Uncle   . Pancreatic cancer Paternal Uncle   . Multiple myeloma  Maternal Grandmother   . Multiple myeloma Maternal Grandfather   . Pancreatic cancer Paternal Grandmother   . Heart attack Paternal Grandfather     Social History   Socioeconomic History  . Marital status: Divorced    Spouse name: Not on file  . Number of children: Not on file  . Years of education: Not on file  . Highest education level: Not on file  Occupational History  . Not on file  Social Needs  . Financial resource strain: Not on file  . Food insecurity:    Worry: Not on file    Inability: Not on file  . Transportation needs:    Medical: Not on file    Non-medical: Not on file  Tobacco Use  . Smoking status: Current Every Day Smoker    Packs/day: 1.00    Types: Cigarettes  . Smokeless tobacco: Never Used  Substance and Sexual Activity  . Alcohol use: Yes    Comment: social  . Drug use: No  . Sexual activity: Not on file  Lifestyle  . Physical activity:    Days per week: Not on file    Minutes per session: Not on file  . Stress: Not on file  Relationships  . Social connections:    Talks on phone: Not on  file    Gets together: Not on file    Attends religious service: Not on file    Active member of club or organization: Not on file    Attends meetings of clubs or organizations: Not on file    Relationship status: Not on file  . Intimate partner violence:    Fear of current or ex partner: Not on file    Emotionally abused: Not on file    Physically abused: Not on file    Forced sexual activity: Not on file  Other Topics Concern  . Not on file  Social History Narrative  . Not on file     Constitutional: Denies fever, malaise, fatigue, headache or abrupt weight changes.  Respiratory: Denies difficulty breathing, shortness of breath, cough or sputum production.   Cardiovascular: Denies chest pain, chest tightness, palpitations or swelling in the hands or feet.  Gastrointestinal: Denies abdominal pain, bloating, constipation, diarrhea or blood in the  stool.  GU: Denies urgency, frequency, pain with urination, burning sensation, blood in urine, odor or discharge. Musculoskeletal: Pt reports right side rib pain. Denies decrease in range of motion, difficulty with gait, muscle pain or joint swelling.  Skin: Denies redness, rashes, lesions or ulcercations.  Neurological: Pt reports numbness in right leg. Denies dizziness, difficulty with memory, difficulty with speech or problems with balance and coordination.    No other specific complaints in a complete review of systems (except as listed in HPI above).  Objective:   Physical Exam   BP 132/82   Pulse (!) 114   Temp 97.8 F (36.6 C) (Oral)   Wt 162 lb (73.5 kg)   SpO2 98%   BMI 26.15 kg/m  Wt Readings from Last 3 Encounters:  04/21/18 162 lb (73.5 kg)  03/29/18 160 lb (72.6 kg)  03/27/13 165 lb (74.8 kg)    General: Appears his stated age, well developed, well nourished in NAD. Skin: Warm, dry and intact. No rashes noted. Cardiovascular: Normal rate and rhythm. S1,S2 noted.  No murmur, rubs or gallops noted. Pedal pulse 1+ on the right. Pulmonary/Chest: Normal effort and positive vesicular breath sounds. No respiratory distress. No wheezes, rales or ronchi noted.  Abdomen: Soft and mildly tender in the RUQ but negative Murphy's. Normal bowel sounds. Umbilical hernia noted. Musculoskeletal: Normal flexion, extension and rotation of the spine. Bony tenderness noted over the lumbar spine. Rib on right side slips and pops with palpation. Unable to tandem walk. Gait normal.  Neurological: Alert and oriented. Negative SLR on the right. Sensation intact to BLE.   BMET    Component Value Date/Time   NA 137 10/18/2006 1442   K 4.0 10/18/2006 1442   CL 105 10/18/2006 1442   CO2 22 10/18/2006 1442   GLUCOSE 95 10/18/2006 1442   BUN 9 10/18/2006 1442   CREATININE 0.83 10/18/2006 1442   CALCIUM 9.1 10/18/2006 1442   GFRNONAA >60 10/18/2006 1442   GFRAA  10/18/2006 1442    >60         The eGFR has been calculated using the MDRD equation. This calculation has not been validated in all clinical    Lipid Panel  No results found for: CHOL, TRIG, HDL, CHOLHDL, VLDL, LDLCALC  CBC    Component Value Date/Time   WBC 12.5 (H) 10/18/2006 1442   RBC 4.98 10/18/2006 1442   HGB 16.2 10/18/2006 1442   HCT 46.2 10/18/2006 1442   PLT 195 10/18/2006 1442   MCV 92.7 10/18/2006 1442  MCHC 35.0 10/18/2006 1442   RDW 12.9 10/18/2006 1442   LYMPHSABS 5.4 (H) 10/18/2006 1442   MONOABS 0.5 10/18/2006 1442   EOSABS 0.1 10/18/2006 1442   BASOSABS 0.1 10/18/2006 1442    Hgb A1C No results found for: HGBA1C         Assessment & Plan:   Slipped Rib Syndrome, RUQ Pain:  Will check CBC, CMET, Amylase, Lipase, Acute Hep Panel  Advised him there is not a lot I can do for this Discussed splinting with coughing or sneezing Continue Aleve prn  Low Back Pain, Right Leg Numbness:  Sciatica vs PAD Xray lumbar spine today Will obtain ABI's  Will follow up after labs, xray and ultrasound complete, return precautions disucssed Webb Silversmith, NP

## 2018-04-21 NOTE — Patient Instructions (Signed)
How to Use a Back Brace °A back brace is a form-fitting device that wraps around your trunk to support your lower back, abdomen, and hips. You may need to wear a back brace to relieve back pain or to correct a medical condition related to the back, such as abnormal curvature of the spine (scoliosis). A back brace can maintain or correct the shape of the spine and prevent a spinal problem from getting worse. A back brace can also take pressure off the layers of tissue (disks) between the bones of the spine (vertebrae). You may need a back brace to keep your back and spine in place while you heal from an injury or recover from surgery. °Back braces can be either plastic (rigid brace) or soft elastic (dynamic brace). A rigid brace usually covers both the front and back of the entire upper body. A soft brace may cover only the lower back and abdomen and may fasten with self-adhesive elastic straps. Your health care provider will recommend the proper brace for your needs and medical condition. °What are the risks? °· A back brace may not help if you do not wear it as directed by your health care provider. Be sure to wear the brace exactly as instructed in order to prevent further back problems. °· Wearing the brace may be uncomfortable at first. You may have trouble sleeping with it on. It may also be hard for you to do certain activities while wearing the brace. °How to use a back brace °Different types of braces will have different instructions for use. Follow instructions from your health care provider about: °· How to put on the brace. °· When and how often to wear the brace. In some cases, braces may need to be worn for long stretches of time. For example, a brace may need to be worn for 16-23 hours a day when used for scoliosis. °· How to take off the brace. °· Any safety tips you should follow when wearing the brace. This may include: °? Moving carefully while wearing the brace. The brace restricts your movement  and could lead to additional injuries. °? Using a cane or walker for support if you feel unsteady. °? Sitting in high, firm chairs. It may be difficult to stand up from low, soft chairs. ° °How to care for a back brace °· Do not let the back brace get wet. Typically, you will remove the brace for bathing and then put it back on afterward. °· If you have a rigid brace, be sure to store it in a safe place when you are not wearing it. This will help to prevent damage. °· Clean or wash the back brace with mild soap and water as told by your health care provider. °Contact a health care provider if: °· Your brace gets damaged. °· You have pain or discomfort when wearing the back brace. °· Your back pain is getting worse or is not improving over time. °This information is not intended to replace advice given to you by your health care provider. Make sure you discuss any questions you have with your health care provider. °Document Released: 04/23/2011 Document Revised: 05/30/2015 Document Reviewed: 12/26/2014 °Elsevier Interactive Patient Education © 2018 [employer] ° ° ° °

## 2018-04-22 LAB — LIPASE: Lipase: 74 U/L — ABNORMAL HIGH (ref 11.0–59.0)

## 2018-04-22 LAB — CBC
HCT: 49.1 % (ref 39.0–52.0)
Hemoglobin: 16.9 g/dL (ref 13.0–17.0)
MCHC: 34.4 g/dL (ref 30.0–36.0)
MCV: 102.4 fl — ABNORMAL HIGH (ref 78.0–100.0)
Platelets: 208 10*3/uL (ref 150.0–400.0)
RBC: 4.8 Mil/uL (ref 4.22–5.81)
RDW: 13.4 % (ref 11.5–15.5)
WBC: 8.3 10*3/uL (ref 4.0–10.5)

## 2018-04-22 LAB — HEPATITIS PANEL, ACUTE
Hep A IgM: NONREACTIVE
Hep B C IgM: NONREACTIVE
Hepatitis B Surface Ag: NONREACTIVE
Hepatitis C Ab: NONREACTIVE
SIGNAL TO CUT-OFF: 0.02 (ref <1.00)

## 2018-04-22 LAB — COMPREHENSIVE METABOLIC PANEL
ALT: 25 U/L (ref 0–53)
AST: 21 U/L (ref 0–37)
Albumin: 4 g/dL (ref 3.5–5.2)
Alkaline Phosphatase: 71 U/L (ref 39–117)
BUN: 9 mg/dL (ref 6–23)
CO2: 28 mEq/L (ref 19–32)
Calcium: 9.7 mg/dL (ref 8.4–10.5)
Chloride: 101 mEq/L (ref 96–112)
Creatinine, Ser: 0.96 mg/dL (ref 0.40–1.50)
GFR: 84.13 mL/min (ref >60.00)
Glucose, Bld: 132 mg/dL — ABNORMAL HIGH (ref 70–99)
Potassium: 4.1 mEq/L (ref 3.5–5.1)
Sodium: 138 mEq/L (ref 135–145)
Total Bilirubin: 0.5 mg/dL (ref 0.2–1.2)
Total Protein: 6.9 g/dL (ref 6.0–8.3)

## 2018-04-22 LAB — AMYLASE: Amylase: 58 U/L (ref 27–131)

## 2018-04-25 ENCOUNTER — Other Ambulatory Visit: Payer: Self-pay | Admitting: Internal Medicine

## 2018-04-25 DIAGNOSIS — R200 Anesthesia of skin: Secondary | ICD-10-CM

## 2018-04-25 DIAGNOSIS — F17200 Nicotine dependence, unspecified, uncomplicated: Secondary | ICD-10-CM

## 2018-04-25 DIAGNOSIS — I739 Peripheral vascular disease, unspecified: Secondary | ICD-10-CM

## 2018-04-27 ENCOUNTER — Other Ambulatory Visit: Payer: Self-pay | Admitting: Internal Medicine

## 2018-04-27 DIAGNOSIS — M940 Chondrocostal junction syndrome [Tietze]: Secondary | ICD-10-CM

## 2018-04-27 DIAGNOSIS — R0781 Pleurodynia: Secondary | ICD-10-CM

## 2018-04-28 ENCOUNTER — Ambulatory Visit (HOSPITAL_COMMUNITY)
Admission: RE | Admit: 2018-04-28 | Discharge: 2018-04-28 | Disposition: A | Discharge status: Home / Self Care | Payer: Managed Care, Other (non HMO) | Source: Ambulatory Visit | Attending: Cardiology | Admitting: Cardiology

## 2018-04-28 ENCOUNTER — Other Ambulatory Visit: Payer: Self-pay | Admitting: Internal Medicine

## 2018-04-28 DIAGNOSIS — I739 Peripheral vascular disease, unspecified: Secondary | ICD-10-CM

## 2018-04-28 DIAGNOSIS — R200 Anesthesia of skin: Secondary | ICD-10-CM | POA: Diagnosis present

## 2018-04-28 DIAGNOSIS — F17200 Nicotine dependence, unspecified, uncomplicated: Secondary | ICD-10-CM

## 2018-04-28 NOTE — Progress Notes (Signed)
a 

## 2018-05-04 ENCOUNTER — Telehealth: Payer: Self-pay

## 2018-05-04 ENCOUNTER — Encounter: Payer: Self-pay | Admitting: Internal Medicine

## 2018-05-04 NOTE — Telephone Encounter (Signed)
-----   Message from Lorre Munroeegina W Baity, NP sent at 04/28/2018  3:44 PM EST ----- Call pt:  ABI's abnormal. I am referring to vascular for further evaluation. He should really consider quitting smoking and this will slow down the disease process.

## 2018-05-04 NOTE — Telephone Encounter (Signed)
Pt is aware as instructed and expressed understanding... pt wants to waint until after Christmas and can only schedule appt for Mon and Thurs afternoons

## 2018-06-09 ENCOUNTER — Encounter: Payer: Self-pay | Admitting: Vascular Surgery

## 2018-06-09 ENCOUNTER — Other Ambulatory Visit: Payer: Self-pay

## 2018-06-09 ENCOUNTER — Ambulatory Visit: Payer: Managed Care, Other (non HMO) | Admitting: Vascular Surgery

## 2018-06-09 VITALS — BP 154/99 | HR 109 | Temp 97.7°F | Resp 18 | Ht 66.0 in | Wt 160.0 lb

## 2018-06-09 DIAGNOSIS — I739 Peripheral vascular disease, unspecified: Secondary | ICD-10-CM

## 2018-06-09 DIAGNOSIS — I70219 Atherosclerosis of native arteries of extremities with intermittent claudication, unspecified extremity: Secondary | ICD-10-CM | POA: Diagnosis not present

## 2018-06-09 DIAGNOSIS — R0789 Other chest pain: Secondary | ICD-10-CM | POA: Diagnosis not present

## 2018-06-09 NOTE — Progress Notes (Signed)
Referring Physician: Webb Silversmith NP  Patient name: Martin Soto MRN: [id number] DOB: [birth year] Sex: male  REASON FOR CONSULT: Bilateral leg pain right greater than left, right chest wall pain  HPI: Martin Soto is a 63 y.o. male, with about a one-year history of right leg pain which occurred after an episode of sneezing.  He developed right-sided chest pain with radiation down the right leg.  This is persisted for the last year.  He states that he also has some pain in the left leg but the right leg is much worse.  He develops pain in the right side of his chest with breathing deeply or coughing.  He has pain in the right thigh and calf after ambulating about a half a block.  The left leg has similar symptoms as far as the walking is concerned.  He has no rest pain.  He has no nonhealing wounds.  He does currently smoke about 5 cigarettes/day.  He is trying to eliminate this.  He was a former 2 to 3 pack/day smoker.  He denies any chest pain.  He denies any shortness of breath.  He does have a chronic cough.  Prior chest x-ray showed no significant findings.  This was done in November.  Other medical problems include hypertension and reflux both of which have been stable.  Denies family history of abdominal aortic aneurysm.  He is very debilitated in his walking ability currently and is unable to have a reasonable quality of life due to his very short distance claudication.  He is hardly able to walk across his house without getting leg pain and having to stop walking.  Past Medical History:  Diagnosis Date  . GERD (gastroesophageal reflux disease)   . Hypertension    Past Surgical History:  Procedure Laterality Date  . HERNIA REPAIR      Family History  Problem Relation Age of Onset  . Alzheimer's disease Mother   . Multiple myeloma Maternal Uncle   . Pancreatic cancer Paternal Uncle   . Multiple myeloma Maternal Grandmother   . Multiple myeloma Maternal Grandfather   .  Pancreatic cancer Paternal Grandmother   . Heart attack Paternal Grandfather     SOCIAL HISTORY: Social History   Socioeconomic History  . Marital status: Divorced    Spouse name: Not on file  . Number of children: Not on file  . Years of education: Not on file  . Highest education level: Not on file  Occupational History  . Not on file  Social Needs  . Financial resource strain: Not on file  . Food insecurity:    Worry: Not on file    Inability: Not on file  . Transportation needs:    Medical: Not on file    Non-medical: Not on file  Tobacco Use  . Smoking status: Current Every Day Smoker    Packs/day: 1.00    Types: Cigarettes  . Smokeless tobacco: Never Used  Substance and Sexual Activity  . Alcohol use: Yes    Comment: social  . Drug use: No  . Sexual activity: Not on file  Lifestyle  . Physical activity:    Days per week: Not on file    Minutes per session: Not on file  . Stress: Not on file  Relationships  . Social connections:    Talks on phone: Not on file    Gets together: Not on file    Attends religious service: Not on file  Active member of club or organization: Not on file    Attends meetings of clubs or organizations: Not on file    Relationship status: Not on file  . Intimate partner violence:    Fear of current or ex partner: Not on file    Emotionally abused: Not on file    Physically abused: Not on file    Forced sexual activity: Not on file  Other Topics Concern  . Not on file  Social History Narrative  . Not on file    Allergies  Allergen Reactions  . Lisinopril-Hydrochlorothiazide Nausea And Vomiting    Headache, dizzy, blurred vision, toes numb  . Penicillins     Current Outpatient Medications  Medication Sig Dispense Refill  . Ibuprofen-Diphenhydramine Cit (ADVIL PM PO) Take 1 tablet by mouth at bedtime.    . Multiple Vitamin (MULTI VITAMIN MENS PO) Take by mouth.    . naproxen sodium (ALEVE) 220 MG tablet Take 440 mg by  mouth 3 (three) times daily.     . ranitidine (ZANTAC) 150 MG capsule Take 150 mg by mouth 2 (two) times daily.    . predniSONE (STERAPRED UNI-PAK 21 TAB) 10 MG (21) TBPK tablet TAKE 6 TABLETS ON DAY 1 AS DIRECTED ON PACKAGE AND DECREASE BY 1 TAB EACH DAY FOR A TOTAL OF 6 DAYS  0   No current facility-administered medications for this visit.     ROS:   General:  No weight loss, Fever, chills  HEENT: No recent headaches, no nasal bleeding, no visual changes, no sore throat  Neurologic: No dizziness, blackouts, seizures. No recent symptoms of stroke or mini- stroke. No recent episodes of slurred speech, or temporary blindness.  Cardiac: No recent episodes of chest pain/pressure, no shortness of breath at rest. + shortness of breath with exertion.  Denies history of atrial fibrillation or irregular heartbeat  Vascular: No history of rest pain in feet.  + history of claudication.  No history of non-healing ulcer, No history of DVT   Pulmonary: No home oxygen, + productive cough, no hemoptysis,  No asthma or wheezing  Musculoskeletal:  '[ ]'$  Arthritis, '[ ]'$  Low back pain,  '[ ]'$  Joint pain  Hematologic:No history of hypercoagulable state.  No history of easy bleeding.  No history of anemia  Gastrointestinal: No hematochezia or melena,  No gastroesophageal reflux, no trouble swallowing  Urinary: '[ ]'$  chronic Kidney disease, '[ ]'$  on HD - '[ ]'$  MWF or '[ ]'$  TTHS, '[ ]'$  Burning with urination, '[ ]'$  Frequent urination, '[ ]'$  Difficulty urinating;   Skin: No rashes  Psychological: No history of anxiety,  No history of depression   Physical Examination  Vitals:   06/09/18 1246  BP: (!) 154/99  Pulse: (!) 109  Resp: 18  Temp: 97.7 F (36.5 C)  TempSrc: Oral  SpO2: 98%  Weight: 160 lb (72.6 kg)  Height: '5\' 6"'$  (1.676 m)    Body mass index is 25.82 kg/m.  General:  Alert and oriented, no acute distress HEENT: Normal Neck: No bruit or JVD Pulmonary: Clear to auscultation bilaterally Cardiac:  Regular Rate and Rhythm without murmur Abdomen: Soft, non-tender, non-distended, no mass, umbilical hernia Skin: No rash Extremity Pulses:  2+ radial, brachial, absent femoral, dorsalis pedis, posterior tibial pulses bilaterally Musculoskeletal: No deformity or edema  Neurologic: Upper and lower extremity motor 5/5 and symmetric  DATA:  Patient had bilateral ABIs performed on the 27th 2019.  Reviewed the study today.  Right side was 0.59 left  0.73.  Mental pressures suggested inflow occlusive disease.  ASSESSMENT: #1 right-sided chest pain.  I do not believe that this has a vascular etiology.  It is probably more musculoskeletal in origin.  Chest x-ray showed no significant findings.  We are going to schedule him for CT angios with runoff so we should be able to see at least the bottom portion of his chest on the CT scan is the area that he complains of.  2.  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  We will obtain a CT angiogram with bilateral lower extremity runoff to obtain a roadmap on possible options of open surgical versus endovascular options for revascularization.  The patient will return for follow-up after his CT scan to discuss the findings.  At that office visit we will also talk to him about starting aspirin therapy and also consider starting a statin at that time.   PLAN: See above   Ruta Hinds, MD [facility] Office: [phone] Pager: [phone]

## 2018-06-13 ENCOUNTER — Other Ambulatory Visit: Payer: Self-pay | Admitting: Vascular Surgery

## 2018-06-14 ENCOUNTER — Other Ambulatory Visit: Payer: Managed Care, Other (non HMO)

## 2018-06-16 ENCOUNTER — Ambulatory Visit: Payer: Managed Care, Other (non HMO) | Admitting: Vascular Surgery

## 2018-06-22 ENCOUNTER — Ambulatory Visit
Admission: RE | Admit: 2018-06-22 | Discharge: 2018-06-22 | Disposition: A | Discharge status: Home / Self Care | Payer: Managed Care, Other (non HMO) | Source: Ambulatory Visit | Attending: Vascular Surgery | Admitting: Vascular Surgery

## 2018-06-22 DIAGNOSIS — I739 Peripheral vascular disease, unspecified: Secondary | ICD-10-CM

## 2018-06-22 DIAGNOSIS — I70219 Atherosclerosis of native arteries of extremities with intermittent claudication, unspecified extremity: Secondary | ICD-10-CM

## 2018-06-22 MED ORDER — IOPAMIDOL (ISOVUE-370) INJECTION 76%
125.0000 mL | Freq: Once | INTRAVENOUS | Status: AC | PRN
  Administered 2018-06-22: 125 mL via INTRAVENOUS

## 2018-06-23 ENCOUNTER — Ambulatory Visit: Payer: Managed Care, Other (non HMO) | Admitting: Vascular Surgery

## 2018-06-23 ENCOUNTER — Encounter: Payer: Self-pay | Admitting: *Deleted

## 2018-06-23 ENCOUNTER — Encounter: Payer: Self-pay | Admitting: Vascular Surgery

## 2018-06-23 ENCOUNTER — Other Ambulatory Visit: Payer: Self-pay

## 2018-06-23 ENCOUNTER — Other Ambulatory Visit: Payer: Self-pay | Admitting: *Deleted

## 2018-06-23 VITALS — BP 146/94 | HR 119 | Resp 18 | Ht 66.0 in | Wt 160.0 lb

## 2018-06-23 DIAGNOSIS — I739 Peripheral vascular disease, unspecified: Secondary | ICD-10-CM

## 2018-06-23 MED ORDER — ASPIRIN 325 MG PO TABS: 325.0000 mg | ORAL_TABLET | Freq: Every day | ORAL | Status: DC

## 2018-06-23 MED ORDER — SIMVASTATIN 10 MG PO TABS: 10.0000 mg | ORAL_TABLET | Freq: Every day | ORAL | 6 refills | Status: DC

## 2018-06-23 NOTE — H&P (View-Only) (Signed)
Patient is a 63 year old male who returns for follow-up today.  He was last seen on June 09, 2018.  He currently complains of pain primarily in the right leg but also some in the left leg.  This occurs mainly when he is walking.  It occurs at about a half a block.  It is unchanged from his last office visit.  He does not have rest pain.  He is continuing to work on smoking cessation.  He still complains of right subcostal chest pain and has a click on this side.  This is exacerbated when he wears his safety belt in the car.  He is not currently on aspirin or statin.  Other chronic medical problems include hypertension and reflux both of which have been stable.  Review of systems: He denies shortness of breath.  Chest pain as mentioned above right costal margin no substernal chest pain  Current Outpatient Medications on File Prior to Visit  Medication Sig Dispense Refill  . cyanocobalamin 2000 MCG tablet Take 2,000 mcg by mouth daily.    . Multiple Vitamin (MULTI VITAMIN MENS PO) Take by mouth.    . naproxen sodium (ALEVE) 220 MG tablet Take 440 mg by mouth 3 (three) times daily.     . ranitidine (ZANTAC) 150 MG capsule Take 150 mg by mouth 2 (two) times daily.    . vitamin C (ASCORBIC ACID) 500 MG tablet Take 500 mg by mouth daily.    . Ibuprofen-Diphenhydramine Cit (ADVIL PM PO) Take 1 tablet by mouth at bedtime.    . predniSONE (STERAPRED UNI-PAK 21 TAB) 10 MG (21) TBPK tablet TAKE 6 TABLETS ON DAY 1 AS DIRECTED ON PACKAGE AND DECREASE BY 1 TAB EACH DAY FOR A TOTAL OF 6 DAYS  0   No current facility-administered medications on file prior to visit.    Allergies  Allergen Reactions  . Lisinopril-Hydrochlorothiazide Nausea And Vomiting    Headache, dizzy, blurred vision, toes numb  . Penicillins     Past Medical History:  Diagnosis Date  . GERD (gastroesophageal reflux disease)   . Hypertension    Past Surgical History:  Procedure Laterality Date  . HERNIA REPAIR      Physical  exam:  Vitals:   06/23/18 1345  BP: (!) 146/94  Pulse: (!) 119  Resp: 18  SpO2: 98%  Weight: 160 lb (72.6 kg)  Height: 5\' 6"  (1.676 m)   Neck: No carotid bruits  Chest clear to auscultation with a palpable click on the right side with inspiration.  Abdomen: Soft nontender  Extremities: Absent femoral pulses  Data: Patient had a CT angiogram which I reviewed today.  This shows occlusion of the right common iliac artery and a 90% stenosis at the origin of the left common iliac artery.  Runoff appears intact bilaterally.  There was no obvious mass or other worrisome factor along his right rib cage.  Assessment: #1 right-sided chest pain probably prior rib injury hopefully will improve with time  2.  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  Plan: We will schedule patient for aortogram bilateral lower extremity runoff possible intervention on July 08, 2018.  He will also require being out of work for the following week ending July 18, 2018.  He was also started today on simvastatin and aspirin.  Fabienne Brunsharles Fields, MD [facility] [place] Office: [phone] Pager: [phone]

## 2018-06-23 NOTE — H&P (View-Only) (Signed)
Patient is a 63-year-old male who returns for follow-up today.  He was last seen on June 09, 2018.  He currently complains of pain primarily in the right leg but also some in the left leg.  This occurs mainly when he is walking.  It occurs at about a half a block.  It is unchanged from his last office visit.  He does not have rest pain.  He is continuing to work on smoking cessation.  He still complains of right subcostal chest pain and has a click on this side.  This is exacerbated when he wears his safety belt in the car.  He is not currently on aspirin or statin.  Other chronic medical problems include hypertension and reflux both of which have been stable.  Review of systems: He denies shortness of breath.  Chest pain as mentioned above right costal margin no substernal chest pain  Current Outpatient Medications on File Prior to Visit  Medication Sig Dispense Refill  . cyanocobalamin 2000 MCG tablet Take 2,000 mcg by mouth daily.    . Multiple Vitamin (MULTI VITAMIN MENS PO) Take by mouth.    . naproxen sodium (ALEVE) 220 MG tablet Take 440 mg by mouth 3 (three) times daily.     . ranitidine (ZANTAC) 150 MG capsule Take 150 mg by mouth 2 (two) times daily.    . vitamin C (ASCORBIC ACID) 500 MG tablet Take 500 mg by mouth daily.    . Ibuprofen-Diphenhydramine Cit (ADVIL PM PO) Take 1 tablet by mouth at bedtime.    . predniSONE (STERAPRED UNI-PAK 21 TAB) 10 MG (21) TBPK tablet TAKE 6 TABLETS ON DAY 1 AS DIRECTED ON PACKAGE AND DECREASE BY 1 TAB EACH DAY FOR A TOTAL OF 6 DAYS  0   No current facility-administered medications on file prior to visit.    Allergies  Allergen Reactions  . Lisinopril-Hydrochlorothiazide Nausea And Vomiting    Headache, dizzy, blurred vision, toes numb  . Penicillins     Past Medical History:  Diagnosis Date  . GERD (gastroesophageal reflux disease)   . Hypertension    Past Surgical History:  Procedure Laterality Date  . HERNIA REPAIR      Physical  exam:  Vitals:   06/23/18 1345  BP: (!) 146/94  Pulse: (!) 119  Resp: 18  SpO2: 98%  Weight: 160 lb (72.6 kg)  Height: 5' 6" (1.676 m)   Neck: No carotid bruits  Chest clear to auscultation with a palpable click on the right side with inspiration.  Abdomen: Soft nontender  Extremities: Absent femoral pulses  Data: Patient had a CT angiogram which I reviewed today.  This shows occlusion of the right common iliac artery and a 90% stenosis at the origin of the left common iliac artery.  Runoff appears intact bilaterally.  There was no obvious mass or other worrisome factor along his right rib cage.  Assessment: #1 right-sided chest pain probably prior rib injury hopefully will improve with time  2.  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  Plan: We will schedule patient for aortogram bilateral lower extremity runoff possible intervention on July 08, 2018.  He will also require being out of work for the following week ending July 18, 2018.  He was also started today on simvastatin and aspirin.  Charles Fields, MD [facility] Office: [phone] Pager: [phone]    

## 2018-06-23 NOTE — Progress Notes (Signed)
Call to patient and instructed to be at [facility] admitting at 7 am on 07/08/2018.

## 2018-06-23 NOTE — Progress Notes (Signed)
Patient is a 63-year-old male who returns for follow-up today.  He was last seen on June 09, 2018.  He currently complains of pain primarily in the right leg but also some in the left leg.  This occurs mainly when he is walking.  It occurs at about a half a block.  It is unchanged from his last office visit.  He does not have rest pain.  He is continuing to work on smoking cessation.  He still complains of right subcostal chest pain and has a click on this side.  This is exacerbated when he wears his safety belt in the car.  He is not currently on aspirin or statin.  Other chronic medical problems include hypertension and reflux both of which have been stable.  Review of systems: He denies shortness of breath.  Chest pain as mentioned above right costal margin no substernal chest pain  Current Outpatient Medications on File Prior to Visit  Medication Sig Dispense Refill  . cyanocobalamin 2000 MCG tablet Take 2,000 mcg by mouth daily.    . Multiple Vitamin (MULTI VITAMIN MENS PO) Take by mouth.    . naproxen sodium (ALEVE) 220 MG tablet Take 440 mg by mouth 3 (three) times daily.     . ranitidine (ZANTAC) 150 MG capsule Take 150 mg by mouth 2 (two) times daily.    . vitamin C (ASCORBIC ACID) 500 MG tablet Take 500 mg by mouth daily.    . Ibuprofen-Diphenhydramine Cit (ADVIL PM PO) Take 1 tablet by mouth at bedtime.    . predniSONE (STERAPRED UNI-PAK 21 TAB) 10 MG (21) TBPK tablet TAKE 6 TABLETS ON DAY 1 AS DIRECTED ON PACKAGE AND DECREASE BY 1 TAB EACH DAY FOR A TOTAL OF 6 DAYS  0   No current facility-administered medications on file prior to visit.    Allergies  Allergen Reactions  . Lisinopril-Hydrochlorothiazide Nausea And Vomiting    Headache, dizzy, blurred vision, toes numb  . Penicillins     Past Medical History:  Diagnosis Date  . GERD (gastroesophageal reflux disease)   . Hypertension    Past Surgical History:  Procedure Laterality Date  . HERNIA REPAIR      Physical  exam:  Vitals:   06/23/18 1345  BP: (!) 146/94  Pulse: (!) 119  Resp: 18  SpO2: 98%  Weight: 160 lb (72.6 kg)  Height: 5' 6" (1.676 m)   Neck: No carotid bruits  Chest clear to auscultation with a palpable click on the right side with inspiration.  Abdomen: Soft nontender  Extremities: Absent femoral pulses  Data: Patient had a CT angiogram which I reviewed today.  This shows occlusion of the right common iliac artery and a 90% stenosis at the origin of the left common iliac artery.  Runoff appears intact bilaterally.  There was no obvious mass or other worrisome factor along his right rib cage.  Assessment: #1 right-sided chest pain probably prior rib injury hopefully will improve with time  2.  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  Plan: We will schedule patient for aortogram bilateral lower extremity runoff possible intervention on July 08, 2018.  He will also require being out of work for the following week ending July 18, 2018.  He was also started today on simvastatin and aspirin.  Eloisa Chokshi, MD [facility] Office: [phone] Pager: [phone]    

## 2018-06-30 ENCOUNTER — Ambulatory Visit: Payer: Managed Care, Other (non HMO) | Admitting: Vascular Surgery

## 2018-07-08 ENCOUNTER — Ambulatory Visit (HOSPITAL_COMMUNITY)
Admission: RE | Admit: 2018-07-08 | Discharge: 2018-07-08 | Disposition: A | Discharge status: Home / Self Care | Payer: Managed Care, Other (non HMO) | Source: Home / Self Care | Attending: Vascular Surgery | Admitting: Vascular Surgery

## 2018-07-08 ENCOUNTER — Encounter (HOSPITAL_COMMUNITY): Payer: Self-pay | Admitting: *Deleted

## 2018-07-08 ENCOUNTER — Telehealth: Payer: Self-pay | Admitting: *Deleted

## 2018-07-08 ENCOUNTER — Other Ambulatory Visit: Payer: Self-pay

## 2018-07-08 ENCOUNTER — Other Ambulatory Visit: Payer: Self-pay | Admitting: *Deleted

## 2018-07-08 ENCOUNTER — Inpatient Hospital Stay (HOSPITAL_COMMUNITY)
Admission: RE | Disposition: A | Discharge status: Home / Self Care | Payer: Self-pay | Source: Home / Self Care | Attending: Vascular Surgery

## 2018-07-08 ENCOUNTER — Ambulatory Visit (HOSPITAL_COMMUNITY): Payer: Managed Care, Other (non HMO)

## 2018-07-08 DIAGNOSIS — Z880 Allergy status to penicillin: Secondary | ICD-10-CM

## 2018-07-08 DIAGNOSIS — I70212 Atherosclerosis of native arteries of extremities with intermittent claudication, left leg: Secondary | ICD-10-CM | POA: Diagnosis not present

## 2018-07-08 DIAGNOSIS — Z888 Allergy status to other drugs, medicaments and biological substances status: Secondary | ICD-10-CM | POA: Insufficient documentation

## 2018-07-08 DIAGNOSIS — I70213 Atherosclerosis of native arteries of extremities with intermittent claudication, bilateral legs: Secondary | ICD-10-CM

## 2018-07-08 DIAGNOSIS — I10 Essential (primary) hypertension: Secondary | ICD-10-CM | POA: Insufficient documentation

## 2018-07-08 DIAGNOSIS — K219 Gastro-esophageal reflux disease without esophagitis: Secondary | ICD-10-CM

## 2018-07-08 DIAGNOSIS — R0789 Other chest pain: Secondary | ICD-10-CM | POA: Insufficient documentation

## 2018-07-08 HISTORY — DX: Unspecified asthma, uncomplicated: J45.909

## 2018-07-08 HISTORY — PX: PERIPHERAL VASCULAR INTERVENTION: CATH118257

## 2018-07-08 HISTORY — PX: ABDOMINAL AORTOGRAM W/LOWER EXTREMITY: CATH118223

## 2018-07-08 HISTORY — DX: Dyspnea, unspecified: R06.00

## 2018-07-08 HISTORY — DX: Peripheral vascular disease, unspecified: I73.9

## 2018-07-08 HISTORY — DX: Chest pain on breathing: R07.1

## 2018-07-08 LAB — COMPREHENSIVE METABOLIC PANEL
ALBUMIN: 3.4 g/dL — AB (ref 3.5–5.0)
ALT: 35 U/L (ref 0–44)
AST: 28 U/L (ref 15–41)
Alkaline Phosphatase: 54 U/L (ref 38–126)
Anion gap: 8 (ref 5–15)
BUN: 14 mg/dL (ref 8–23)
CO2: 22 mmol/L (ref 22–32)
Calcium: 8.8 mg/dL — ABNORMAL LOW (ref 8.9–10.3)
Chloride: 108 mmol/L (ref 98–111)
Creatinine, Ser: 0.86 mg/dL (ref 0.61–1.24)
GFR calc Af Amer: >60 mL/min (ref >60)
GFR calc non Af Amer: >60 mL/min (ref >60)
GLUCOSE: 121 mg/dL — AB (ref 70–99)
Potassium: 3.7 mmol/L (ref 3.5–5.1)
SODIUM: 138 mmol/L (ref 135–145)
Total Bilirubin: 0.5 mg/dL (ref 0.3–1.2)
Total Protein: 5.7 g/dL — ABNORMAL LOW (ref 6.5–8.1)

## 2018-07-08 LAB — POCT I-STAT CREATININE: Creatinine, Ser: 1 mg/dL (ref 0.61–1.24)

## 2018-07-08 LAB — CBC
HCT: 43 % (ref 39.0–52.0)
Hemoglobin: 14.8 g/dL (ref 13.0–17.0)
MCH: 34 pg (ref 26.0–34.0)
MCHC: 34.4 g/dL (ref 30.0–36.0)
MCV: 98.9 fL (ref 80.0–100.0)
PLATELETS: 152 10*3/uL (ref 150–400)
RBC: 4.35 MIL/uL (ref 4.22–5.81)
RDW: 12.5 % (ref 11.5–15.5)
WBC: 9.8 10*3/uL (ref 4.0–10.5)
nRBC: 0 % (ref 0.0–0.2)

## 2018-07-08 LAB — POCT ACTIVATED CLOTTING TIME
ACTIVATED CLOTTING TIME: 318 s
Activated Clotting Time: 235 seconds
Activated Clotting Time: 186 seconds

## 2018-07-08 LAB — TYPE AND SCREEN
ABO/RH(D): O POS
Antibody Screen: NEGATIVE

## 2018-07-08 LAB — URINALYSIS, ROUTINE W REFLEX MICROSCOPIC
Bilirubin Urine: NEGATIVE
Glucose, UA: NEGATIVE mg/dL
Hgb urine dipstick: NEGATIVE
KETONES UR: NEGATIVE mg/dL
Leukocytes,Ua: NEGATIVE
Nitrite: NEGATIVE
Protein, ur: NEGATIVE mg/dL
Specific Gravity, Urine: >1.046 — ABNORMAL HIGH (ref 1.005–1.030)
pH: 6 (ref 5.0–8.0)

## 2018-07-08 LAB — SURGICAL PCR SCREEN
MRSA, PCR: NEGATIVE
Staphylococcus aureus: NEGATIVE

## 2018-07-08 LAB — POCT I-STAT 4, (NA,K, GLUC, HGB,HCT)
Glucose, Bld: 108 mg/dL — ABNORMAL HIGH (ref 70–99)
HCT: 48 % (ref 39.0–52.0)
Hemoglobin: 16.3 g/dL (ref 13.0–17.0)
Potassium: 4.2 mmol/L (ref 3.5–5.1)
Sodium: 139 mmol/L (ref 135–145)

## 2018-07-08 LAB — PROTIME-INR
INR: 1.1
Prothrombin Time: 14.1 seconds (ref 11.4–15.2)

## 2018-07-08 LAB — ABO/RH: ABO/RH(D): O POS

## 2018-07-08 LAB — APTT: APTT: 29 s (ref 24–36)

## 2018-07-08 SURGERY — ABDOMINAL AORTOGRAM W/LOWER EXTREMITY
Anesthesia: LOCAL | Laterality: Bilateral

## 2018-07-08 MED ORDER — SODIUM CHLORIDE 0.9% FLUSH: 3.0000 mL | INTRAVENOUS | Status: DC | PRN

## 2018-07-08 MED ORDER — ONDANSETRON HCL 4 MG/2ML IJ SOLN: 4.0000 mg | Freq: Four times a day (QID) | INTRAMUSCULAR | Status: DC | PRN

## 2018-07-08 MED ORDER — SODIUM CHLORIDE 0.9% FLUSH: 3.0000 mL | Freq: Two times a day (BID) | INTRAVENOUS | Status: DC

## 2018-07-08 MED ORDER — FENTANYL CITRATE (PF) 100 MCG/2ML IJ SOLN
INTRAMUSCULAR | Status: AC
  Filled 2018-07-08: qty 2

## 2018-07-08 MED ORDER — MIDAZOLAM HCL 2 MG/2ML IJ SOLN
INTRAMUSCULAR | Status: DC | PRN
  Administered 2018-07-08 (×2): 1 mg via INTRAVENOUS

## 2018-07-08 MED ORDER — ACETAMINOPHEN 325 MG PO TABS: 650.0000 mg | ORAL_TABLET | ORAL | Status: DC | PRN

## 2018-07-08 MED ORDER — LIDOCAINE HCL (PF) 1 % IJ SOLN
INTRAMUSCULAR | Status: AC
  Filled 2018-07-08: qty 30

## 2018-07-08 MED ORDER — ASPIRIN EC 325 MG PO TBEC
325.0000 mg | DELAYED_RELEASE_TABLET | Freq: Every day | ORAL | Status: DC
  Administered 2018-07-08: 325 mg via ORAL
  Filled 2018-07-08: qty 1

## 2018-07-08 MED ORDER — SODIUM CHLORIDE 0.9 % IV SOLN: INTRAVENOUS | Status: DC

## 2018-07-08 MED ORDER — IODIXANOL 320 MG/ML IV SOLN
INTRAVENOUS | Status: DC | PRN
  Administered 2018-07-08: 215 mL via INTRA_ARTERIAL

## 2018-07-08 MED ORDER — FENTANYL CITRATE (PF) 100 MCG/2ML IJ SOLN
INTRAMUSCULAR | Status: DC | PRN
  Administered 2018-07-08 (×3): 25 ug via INTRAVENOUS

## 2018-07-08 MED ORDER — HEPARIN SODIUM (PORCINE) 1000 UNIT/ML IJ SOLN
INTRAMUSCULAR | Status: DC | PRN
  Administered 2018-07-08: 7000 [IU] via INTRAVENOUS

## 2018-07-08 MED ORDER — LIDOCAINE HCL (PF) 1 % IJ SOLN
INTRAMUSCULAR | Status: DC | PRN
  Administered 2018-07-08 (×2): 15 mL

## 2018-07-08 MED ORDER — LABETALOL HCL 5 MG/ML IV SOLN: 10.0000 mg | INTRAVENOUS | Status: DC | PRN

## 2018-07-08 MED ORDER — HEPARIN (PORCINE) IN NACL 1000-0.9 UT/500ML-% IV SOLN
INTRAVENOUS | Status: AC
  Filled 2018-07-08: qty 1000

## 2018-07-08 MED ORDER — HEPARIN SODIUM (PORCINE) 1000 UNIT/ML IJ SOLN
INTRAMUSCULAR | Status: AC
  Filled 2018-07-08: qty 1

## 2018-07-08 MED ORDER — MORPHINE SULFATE (PF) 10 MG/ML IV SOLN: 2.0000 mg | INTRAVENOUS | Status: DC | PRN

## 2018-07-08 MED ORDER — HEPARIN (PORCINE) IN NACL 1000-0.9 UT/500ML-% IV SOLN
INTRAVENOUS | Status: DC | PRN
  Administered 2018-07-08 (×2): 500 mL

## 2018-07-08 MED ORDER — OXYCODONE HCL 5 MG PO TABS
5.0000 mg | ORAL_TABLET | ORAL | Status: DC | PRN
  Administered 2018-07-08: 5 mg via ORAL
  Filled 2018-07-08: qty 1

## 2018-07-08 MED ORDER — MIDAZOLAM HCL 2 MG/2ML IJ SOLN
INTRAMUSCULAR | Status: AC
  Filled 2018-07-08: qty 2

## 2018-07-08 MED ORDER — SODIUM CHLORIDE 0.9 % IV SOLN: 250.0000 mL | INTRAVENOUS | Status: DC | PRN

## 2018-07-08 MED ORDER — HYDRALAZINE HCL 20 MG/ML IJ SOLN: 5.0000 mg | INTRAMUSCULAR | Status: DC | PRN

## 2018-07-08 MED ORDER — SODIUM CHLORIDE 0.9 % IV SOLN: INTRAVENOUS | Status: AC

## 2018-07-08 SURGICAL SUPPLY — 14 items
CATH ANGIO 5F BER2 65CM (CATHETERS) ×3 IMPLANT
CATH ANGIO 5F PIGTAIL 65CM (CATHETERS) ×3 IMPLANT
GUIDEWIRE ANGLED .035X150CM (WIRE) ×3 IMPLANT
KIT ENCORE 26 ADVANTAGE (KITS) ×3 IMPLANT
KIT PV (KITS) ×3 IMPLANT
SHEATH BRITE TIP 7FR 35CM (SHEATH) ×3 IMPLANT
SHEATH PINNACLE 5F 10CM (SHEATH) ×6 IMPLANT
SHEATH PROBE COVER 6X72 (BAG) ×3 IMPLANT
STENT VIABAHN 8X29X80 VBX (Permanent Stent) ×3 IMPLANT
SYR MEDRAD MARK 7 150ML (SYRINGE) ×3 IMPLANT
TRANSDUCER W/STOPCOCK (MISCELLANEOUS) ×3 IMPLANT
TRAY PV CATH (CUSTOM PROCEDURE TRAY) ×3 IMPLANT
WIRE HITORQ VERSACORE ST 145CM (WIRE) ×3 IMPLANT
WIRE VERSACORE LOC 115CM (WIRE) ×3 IMPLANT

## 2018-07-08 NOTE — Discharge Instructions (Signed)
Femoral Site Care °This sheet gives you information about how to care for yourself after your procedure. Your health care provider may also give you more specific instructions. If you have problems or questions, contact your health care provider. °What can I expect after the procedure? °After the procedure, it is common to have: °· Bruising that usually fades within 1-2 weeks. °· Tenderness at the site. °Follow these instructions at home: °Wound care °· Follow instructions from your health care provider about how to take care of your insertion site. Make sure you: °? Wash your hands with soap and water before you change your bandage (dressing). If soap and water are not available, use hand sanitizer. °? Change your dressing as told by your health care provider. °? Leave stitches (sutures), skin glue, or adhesive strips in place. These skin closures may need to stay in place for 2 weeks or longer. If adhesive strip edges start to loosen and curl up, you may trim the loose edges. Do not remove adhesive strips completely unless your health care provider tells you to do that. °· Do not take baths, swim, or use a hot tub until your health care provider approves. °· You may shower 24-48 hours after the procedure or as told by your health care provider. °? Gently wash the site with plain soap and water. °? Pat the area dry with a clean towel. °? Do not rub the site. This may cause bleeding. °· Do not apply powder or lotion to the site. Keep the site clean and dry. °· Check your femoral site every day for signs of infection. Check for: °? Redness, swelling, or pain. °? Fluid or blood. °? Warmth. °? Pus or a bad smell. °Activity °· For the first 2-3 days after your procedure, or as long as directed: °? Avoid climbing stairs as much as possible. °? Do not squat. °· Do not lift anything that is heavier than 10 lb (4.5 kg), or the limit that you are told, until your health care provider says that it is safe. °· Rest as  directed. °? Avoid sitting for a long time without moving. Get up to take short walks every 1-2 hours. °· Do not drive for 24 hours if you were given a medicine to help you relax (sedative). °General instructions °· Take over-the-counter and prescription medicines only as told by your health care provider. °· Keep all follow-up visits as told by your health care provider. This is important. °Contact a health care provider if you have: °· A fever or chills. °· You have redness, swelling, or pain around your insertion site. °Get help right away if: °· The catheter insertion area swells very fast. °· You pass out. °· You suddenly start to sweat or your skin gets clammy. °· The catheter insertion area is bleeding, and the bleeding does not stop when you hold steady pressure on the area. °· The area near or just beyond the catheter insertion site becomes pale, cool, tingly, or numb. °These symptoms may represent a serious problem that is an emergency. Do not wait to see if the symptoms will go away. Get medical help right away. Call your local emergency services (911 in the [place] Do not drive yourself to the hospital. °Summary °· After the procedure, it is common to have bruising that usually fades within 1-2 weeks. °· Check your femoral site every day for signs of infection. °· Do not lift anything that is heavier than 10 lb (4.5 kg), or the   limit that you are told, until your health care provider says that it is safe. °This information is not intended to replace advice given to you by your health care provider. Make sure you discuss any questions you have with your health care provider. °Document Released: 01/05/2014 Document Revised: 05/17/2017 Document Reviewed: 05/17/2017 °Elsevier Interactive Patient Education © 2019 [employer] ° °Moderate Conscious Sedation, Adult, Care After °These instructions provide you with information about caring for yourself after your procedure. Your health care provider may also give  you more specific instructions. Your treatment has been planned according to current medical practices, but problems sometimes occur. Call your health care provider if you have any problems or questions after your procedure. °What can I expect after the procedure? °After your procedure, it is common: °· To feel sleepy for several hours. °· To feel clumsy and have poor balance for several hours. °· To have poor judgment for several hours. °· To vomit if you eat too soon. °Follow these instructions at home: °For at least 24 hours after the procedure: ° °· Do not: °? Participate in activities where you could fall or become injured. °? Drive. °? Use heavy machinery. °? Drink alcohol. °? Take sleeping pills or medicines that cause drowsiness. °? Make important decisions or sign legal documents. °? Take care of children on your own. °· Rest. °Eating and drinking °· Follow the diet recommended by your health care provider. °· If you vomit: °? Drink water, juice, or soup when you can drink without vomiting. °? Make sure you have little or no nausea before eating solid foods. °General instructions °· Have a responsible adult stay with you until you are awake and alert. °· Take over-the-counter and prescription medicines only as told by your health care provider. °· If you smoke, do not smoke without supervision. °· Keep all follow-up visits as told by your health care provider. This is important. °Contact a health care provider if: °· You keep feeling nauseous or you keep vomiting. °· You feel light-headed. °· You develop a rash. °· You have a fever. °Get help right away if: °· You have trouble breathing. °This information is not intended to replace advice given to you by your health care provider. Make sure you discuss any questions you have with your health care provider. °Document Released: 02/22/2013 Document Revised: 10/07/2015 Document Reviewed: 08/24/2015 °Elsevier Interactive Patient Education © 2019 [employer]  Inc[employer] ° °

## 2018-07-08 NOTE — Progress Notes (Addendum)
79fr sheath aspirated and removed from left femoral artery by Jannet Mantis R.N. Manual pressure applied for 20 minutes. Patient had pain and a distal hematoma. Hematoma  2 inches by 3 inches distal to insertion site. Manual pressure applied over hematoma for 15 minutes additionally.  Site   Site much softer and less painful. Site level 0 hematoma expressed.Tegaderm dressing applied, bedrest instructions given.  Left dp and pt pulses present with doppler , right pt present with doppler, right dp absent.  Bedrest begins at 13:00:00

## 2018-07-08 NOTE — Pre-Procedure Instructions (Signed)
Martin Soto  07/08/2018     Your procedure is scheduled on Monday, February 24. Arrive at 10:00  Report to [facility] Entr[facility] 'A' , register in the Admitting Office.               Your surgery or procedure is scheduled for 12 noon  Call this number if you have problems the morning of surgery: [phone]  This is the number for the Pre- Surgical Desk.  Remember:  Do not eat or drink after midnight Sunday, February 23.          Take these medicines the morning of surgery with A SIP OF WATER : None   Follow Dr Darrick Penna instructions on Aspirin.  STOP taking Aspirin, Aspirin Products (Goody Powder, Excedrin Migraine), Ibuprofen (Advil), Naproxen (Aleve), Vitamins and Herbal Products (ie Fish Oil).  Special instructions:   [place]- Preparing For Surgery  Before surgery, you can play an important role. Because skin is not sterile, your skin needs to be as free of germs as possible. You can reduce the number of germs on your skin by washing with CHG (chlorahexidine gluconate) Soap before surgery.  CHG is an antiseptic cleaner which kills germs and bonds with the skin to continue killing germs even after washing.    Oral Hygiene is also important to reduce your risk of infection.  Remember - BRUSH YOUR TEETH THE MORNING OF SURGERY WITH YOUR REGULAR TOOTHPASTE  Please do not use if you have an allergy to CHG or antibacterial soaps. If your skin becomes reddened/irritated stop using the CHG.  Do not shave (including legs and underarms) for at least 48 hours prior to first CHG shower. It is OK to shave your face.  Please follow these instructions carefully.   1. Shower the NIGHT BEFORE SURGERY and the MORNING OF SURGERY with CHG.   2. If you chose to wash your hair, wash your hair first as usual with your normal shampoo.  3. After you shampoo, wash your face and private area with the soap you use at home, then rinse your hair and body thoroughly to remove the shampoo and  soap.  4. Use CHG as you would any other liquid soap. You can apply CHG directly to the skin and wash gently with a scrungie or a clean washcloth.          Apply the CHG Soap to your body ONLY FROM THE NECK DOWN.  Do not use on open wounds or open sores. Avoid contact with your eyes, ears, mouth and genitals (private parts).   5. Wash thoroughly, paying special attention to the area where your surgery will be performed.  6. Thoroughly rinse your body with warm water from the neck down.  7. DO NOT shower/wash with your normal soap after using and rinsing off the CHG Soap.  8. Pat yourself dry with a CLEAN TOWEL.  9. Wear CLEAN PAJAMAS to bed the night before surgery, wear comfortable clothes the morning of surgery  10. Place CLEAN SHEETS on your bed the night of your first shower and DO NOT SLEEP WITH PETS.  Day of Surgery: Shower as written above  Do not apply any deodorants/lotions.  Please wear clean clothes to the hospital/surgery center.   Remember to brush your teeth WITH YOUR REGULAR TOOTHPASTE.  Do not wear jewelry, make-up or nail polish.  Do not wear powders, or perfumes.  Do not shave 48 hours prior to surgery.  Men may shave  face and neck.  Do not bring valuables to the hospital.  [facility] is not responsible for any belongings or valuables.  Contacts, dentures or bridgework may not be worn into surgery.  Leave your suitcase in the car.  After surgery it may be brought to your room.  For patients admitted to the hospital, discharge time will be determined by your treatment team.  Patients discharged the day of surgery will not be allowed to drive home.   Please read over the following fact sheets that you were given: Pain Booklet, Patient Instructions for Mupirocin Application, Coughing and Deep Breathing, Surgical Site Infections.

## 2018-07-08 NOTE — Telephone Encounter (Signed)
Spoke with [employer] Stay-C. She confirmed patient is being seen now by Pre-op testing getting instructions for 07/11/2018 surgery with Dr. Darrick Penna.

## 2018-07-08 NOTE — Progress Notes (Signed)
Client c/o soreness left groin; left groin soft; however seems slightly larger than right groin; notified Dr Darrick Penna, no new orders noted; called Austin Miles and she came and checked client's groins and states bilat groins level 0

## 2018-07-08 NOTE — Progress Notes (Addendum)
PCP -   Nicki Reaper, NP-C [facility]-  Cardiologist - no  Chest x-ray - NA  EKG - 07/08/2018 Stress Test -   ECHO - no  Cardiac Cath - no  Sleep Study - no CPAP - no  LABS- CBC CMP, PT, PTT,  07/08/2018- 1645 PCR pending 07/08/2018 1645- U/A - patient has not obtained- will void when he is able to stand, Patient, Procedure RN and NT were informed that a U/A is needed- Urine cup and wipe is at patient's bedside.  ASA-will not take am of OR  HA1C-no Fasting Blood Sugar - no Checks Blood Sugar __o__0 times a day  Pt denies having chest pain, sob, or fever at this time. All instructions explained to the pt, with a verbal understanding of the material. Pt agrees to go over the instructions while at home for a better understanding. The opportunity to ask questions was provided.

## 2018-07-08 NOTE — Telephone Encounter (Signed)
Message left for [facility] Pre-Op testing. Patient is posted for surgery on Monday 07/11/2018 and is scheduled to stay overnight past today's procedure in the PV Lab. Possibly see patient today for pre-op appointment for 07/11/2018 surgery.

## 2018-07-08 NOTE — Interval H&P Note (Signed)
History and Physical Interval Note:  07/08/2018 7:29 AM  Martin Soto  has presented today for surgery, with the diagnosis of PVD  The various methods of treatment have been discussed with the patient and family. After consideration of risks, benefits and other options for treatment, the patient has consented to  Procedure(s): ABDOMINAL AORTOGRAM W/LOWER EXTREMITY (Bilateral) as a surgical intervention .  The patient's history has been reviewed, patient examined, no change in status, stable for surgery.  I have reviewed the patient's chart and labs.  Questions were answered to the patient's satisfaction.     Fabienne Bruns

## 2018-07-08 NOTE — Progress Notes (Signed)
Site area: Right groin a arterial sheath was removed  Site Prior to Removal:  Level 0  Pressure Applied 20 For  MINUTES    Bedrest beginning at   Manual:   Yes.    Patient Status During Pull:  stable  Post Pull Groin Site:  Level 0  Post Pull Instructions Given:  Yes.    Post Pull Pulses Present:  Yes.    Dressing Applied:  Yes.    Comments:  VS remain stable

## 2018-07-08 NOTE — Progress Notes (Signed)
Up and walked and tolerated well; bilat groins stable, no bleeding or hematoma 

## 2018-07-08 NOTE — Op Note (Addendum)
Procedure: Abdominal aortogram with bilateral lower extremity runoff, attempted right common iliac intervention, left common iliac stent (8 29 VBX), bilateral femoral access  Preoperative diagnosis: Claudication  Postoperative diagnosis: Same  Anesthesia: Local with IV sedation  Operative findings:  1.  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  Flush occlusion right common iliac artery unsuccessful retrograde recanalization with subintimal dissection along the right lateral wall of the aorta unable to reenter no flow limitation  Operative details: After obtaining informed consent, the patient was taken to the PV lab.  The patient was placed in supine position on the angios table.  Both groins were prepped and draped in usual sterile fashion.  Local anesthesia was infiltrated over the left common femoral artery.  Ultrasound was used to identify the left common femoral artery and femoral bifurcation.  These were patent.  Hardcopy image was obtained placed in the patient's record.  Left common femoral artery was punctured with an introducer needle and an 035 versa core wire threaded up into the abdomen.  Initial fluoroscopic exam showed that this was out a circumflex iliac branch up in the left side of the abdomen.  The wire was pulled back I could not redirected up into the abdominal aorta so the wire was pulled out hemostasis obtained with direct pressure.  I then performed an additional puncture and this time the wire was able to advance up into the abdominal aorta under fluoroscopic guidance.  A 5 French sheath was placed over the guidewire in the left common femoral artery.  This was thoroughly flushed with heparinized saline.  5 French pigtail catheter was advanced over the guidewire to the abdominal aorta and abdominal aortogram was obtained in AP projection.  The left and right renal arteries are widely patent.  The infrarenal abdominal aorta is  patent.  The right common iliac artery has a flush occlusion at its origin.  The left common iliac artery has an 80% stenosis at its origin.  The left internal iliac artery is occluded.  There is minimal opacification of the distal right external iliac artery.  At this point the pigtail catheter was pulled down distally of the aortic bifurcation and bilateral oblique views of the pelvis were performed to further clarify the above findings.  At this point I decided to obtain access in the right common femoral artery to try to recanalize the right common iliac artery.  Ultrasound was used to identify the right common femoral artery and femoral bifurcation.  Local anesthesia was infiltrated over this.  Image was obtained for the patient's medical record.  The vessels were patent.  Introducer needle was then used to cannulate the right common femoral artery and an 035 versa core wire threaded up into the right iliac system under fluoroscopic guidance.  A 5 French sheath placed over the guidewire in the right common femoral artery.  I then swapped out the versa core wire for an 035 angled Glidewire.  The patient was given 7000 units of intravenous heparin.  A Berenstein catheter was advanced over this.  I reflux the wire back on itself and try to cross the lesion.  However, I could not get the wire to reenter the abdominal aorta.  Contrast angiogram was obtained which showed we had a dissection plane along the right lateral wall of aorta.  At this point I abandoned attempts to cross the right common iliac lesion.  Guidewire and the Berenstein catheter removed  from the right side.  We then turned our attention back to the left side.  The pigtail catheter was swapped out over the versa core wire and the 5 [place] sheath was swapped out for a 7 French bright tip sheath and an 8 x 20 9VBX stent was centered at the lesion with its proximal portion just into the abdominal aorta and this was deployed to nominal pressure for 30  seconds.  Completion angiogram showed still evidence of the dissection plane but no extravasation.  The left common iliac artery was widely patent with no residual stenosis.  Mesenteric and renal vessels were not involved in the area of dissection as this was confined to the lower portion of the infrarenal abdominal aorta.  Next lower extremity runoff views were obtained through the pigtail catheter.  The left common femoral profundofemoral superficial femoral-popliteal anterior tibial posterior tibial and peroneal arteries are all widely patent bilaterally.  At this point the pigtail catheter was removed over guidewire.  Both sheaths were thoroughly flushed with heparinized saline.  They were left in place to be pulled after the ACT is less than 175.  The patient tolerated procedure well there was an area of dissection along the right common iliac and aorta which should heal with antegrade flow since this was a retrograde area of dissection.  Patient had successful deployment of a left common iliac stent and he will be scheduled early next week for a left to right femoral-femoral bypass.  Fabienne Bruns, MD [facility] [place] Office: [phone] Pager: [phone]

## 2018-07-11 ENCOUNTER — Inpatient Hospital Stay (HOSPITAL_COMMUNITY): Payer: Managed Care, Other (non HMO) | Admitting: Anesthesiology

## 2018-07-11 ENCOUNTER — Other Ambulatory Visit: Payer: Self-pay

## 2018-07-11 ENCOUNTER — Inpatient Hospital Stay (HOSPITAL_COMMUNITY)
Admission: RE | Admit: 2018-07-11 | Discharge: 2018-07-12 | DRG: 253 | Disposition: A | Discharge status: Home / Self Care | Payer: Managed Care, Other (non HMO) | Attending: Vascular Surgery | Admitting: Vascular Surgery

## 2018-07-11 ENCOUNTER — Encounter (HOSPITAL_COMMUNITY)
Admission: RE | Disposition: A | Discharge status: Home / Self Care | Payer: Self-pay | Source: Home / Self Care | Attending: Vascular Surgery

## 2018-07-11 ENCOUNTER — Encounter (HOSPITAL_COMMUNITY): Payer: Self-pay | Admitting: Vascular Surgery

## 2018-07-11 DIAGNOSIS — I10 Essential (primary) hypertension: Secondary | ICD-10-CM | POA: Diagnosis present

## 2018-07-11 DIAGNOSIS — Z888 Allergy status to other drugs, medicaments and biological substances status: Secondary | ICD-10-CM | POA: Diagnosis not present

## 2018-07-11 DIAGNOSIS — K219 Gastro-esophageal reflux disease without esophagitis: Secondary | ICD-10-CM | POA: Diagnosis present

## 2018-07-11 DIAGNOSIS — I745 Embolism and thrombosis of iliac artery: Secondary | ICD-10-CM | POA: Diagnosis present

## 2018-07-11 DIAGNOSIS — I739 Peripheral vascular disease, unspecified: Secondary | ICD-10-CM | POA: Diagnosis present

## 2018-07-11 DIAGNOSIS — I70212 Atherosclerosis of native arteries of extremities with intermittent claudication, left leg: Secondary | ICD-10-CM

## 2018-07-11 DIAGNOSIS — Z79899 Other long term (current) drug therapy: Secondary | ICD-10-CM | POA: Diagnosis not present

## 2018-07-11 DIAGNOSIS — I708 Atherosclerosis of other arteries: Secondary | ICD-10-CM | POA: Diagnosis present

## 2018-07-11 DIAGNOSIS — I771 Stricture of artery: Secondary | ICD-10-CM | POA: Diagnosis present

## 2018-07-11 DIAGNOSIS — Z880 Allergy status to penicillin: Secondary | ICD-10-CM

## 2018-07-11 HISTORY — PX: FEMORAL-FEMORAL BYPASS GRAFT: SHX936

## 2018-07-11 SURGERY — CREATION, BYPASS, ARTERIAL, FEMORAL TO FEMORAL, USING GRAFT
Anesthesia: General | Laterality: Bilateral

## 2018-07-11 MED ORDER — VANCOMYCIN HCL IN DEXTROSE 1-5 GM/200ML-% IV SOLN
INTRAVENOUS | Status: AC
  Administered 2018-07-11: 1000 mg via INTRAVENOUS
  Filled 2018-07-11: qty 200

## 2018-07-11 MED ORDER — SODIUM CHLORIDE 0.9 % IV SOLN
INTRAVENOUS | Status: DC | PRN
  Administered 2018-07-11: 20 ug/min via INTRAVENOUS

## 2018-07-11 MED ORDER — HEPARIN SODIUM (PORCINE) 1000 UNIT/ML IJ SOLN
INTRAMUSCULAR | Status: DC | PRN
  Administered 2018-07-11: 7000 [IU] via INTRAVENOUS

## 2018-07-11 MED ORDER — PANTOPRAZOLE SODIUM 40 MG PO TBEC
40.0000 mg | DELAYED_RELEASE_TABLET | Freq: Every day | ORAL | Status: DC
  Administered 2018-07-11 – 2018-07-12 (×2): 40 mg via ORAL
  Filled 2018-07-11 (×2): qty 1

## 2018-07-11 MED ORDER — HEPARIN SODIUM (PORCINE) 1000 UNIT/ML IJ SOLN
INTRAMUSCULAR | Status: AC
  Filled 2018-07-11: qty 1

## 2018-07-11 MED ORDER — FAMOTIDINE 10 MG PO TABS
10.0000 mg | ORAL_TABLET | Freq: Every day | ORAL | Status: DC
  Administered 2018-07-12: 10 mg via ORAL
  Filled 2018-07-11: qty 1

## 2018-07-11 MED ORDER — MIDAZOLAM HCL 2 MG/2ML IJ SOLN
INTRAMUSCULAR | Status: AC
  Filled 2018-07-11: qty 2

## 2018-07-11 MED ORDER — POTASSIUM CHLORIDE CRYS ER 20 MEQ PO TBCR: 20.0000 meq | EXTENDED_RELEASE_TABLET | Freq: Every day | ORAL | Status: DC | PRN

## 2018-07-11 MED ORDER — CHLORHEXIDINE GLUCONATE 4 % EX LIQD: 60.0000 mL | Freq: Once | CUTANEOUS | Status: DC

## 2018-07-11 MED ORDER — METOPROLOL TARTRATE 5 MG/5ML IV SOLN: 2.0000 mg | INTRAVENOUS | Status: DC | PRN

## 2018-07-11 MED ORDER — PROPOFOL 10 MG/ML IV BOLUS
INTRAVENOUS | Status: AC
  Filled 2018-07-11: qty 20

## 2018-07-11 MED ORDER — SIMVASTATIN 5 MG PO TABS
10.0000 mg | ORAL_TABLET | Freq: Every evening | ORAL | Status: DC
  Administered 2018-07-11: 10 mg via ORAL
  Filled 2018-07-11 (×3): qty 2

## 2018-07-11 MED ORDER — PHENYLEPHRINE 40 MCG/ML (10ML) SYRINGE FOR IV PUSH (FOR BLOOD PRESSURE SUPPORT)
PREFILLED_SYRINGE | INTRAVENOUS | Status: DC | PRN
  Administered 2018-07-11: 80 ug via INTRAVENOUS
  Administered 2018-07-11: 40 ug via INTRAVENOUS
  Administered 2018-07-11 (×2): 80 ug via INTRAVENOUS

## 2018-07-11 MED ORDER — ROCURONIUM BROMIDE 50 MG/5ML IV SOSY
PREFILLED_SYRINGE | INTRAVENOUS | Status: DC | PRN
  Administered 2018-07-11: 50 mg via INTRAVENOUS
  Administered 2018-07-11: 30 mg via INTRAVENOUS
  Administered 2018-07-11: 20 mg via INTRAVENOUS
  Administered 2018-07-11 (×2): 10 mg via INTRAVENOUS

## 2018-07-11 MED ORDER — SODIUM CHLORIDE 0.9 % IV SOLN: 500.0000 mL | Freq: Once | INTRAVENOUS | Status: DC | PRN

## 2018-07-11 MED ORDER — 0.9 % SODIUM CHLORIDE (POUR BTL) OPTIME
TOPICAL | Status: DC | PRN
  Administered 2018-07-11: 2000 mL

## 2018-07-11 MED ORDER — FENTANYL CITRATE (PF) 250 MCG/5ML IJ SOLN
INTRAMUSCULAR | Status: DC | PRN
  Administered 2018-07-11 (×4): 50 ug via INTRAVENOUS
  Administered 2018-07-11: 100 ug via INTRAVENOUS
  Administered 2018-07-11: 50 ug via INTRAVENOUS

## 2018-07-11 MED ORDER — OXYCODONE HCL 5 MG PO TABS: 5.0000 mg | ORAL_TABLET | Freq: Once | ORAL | Status: DC | PRN

## 2018-07-11 MED ORDER — LIDOCAINE 2% (20 MG/ML) 5 ML SYRINGE
INTRAMUSCULAR | Status: DC | PRN
  Administered 2018-07-11: 60 mg via INTRAVENOUS

## 2018-07-11 MED ORDER — DIPHENHYDRAMINE HCL 25 MG PO CAPS: 50.0000 mg | ORAL_CAPSULE | Freq: Every evening | ORAL | Status: DC | PRN

## 2018-07-11 MED ORDER — PROTAMINE SULFATE 10 MG/ML IV SOLN
INTRAVENOUS | Status: DC | PRN
  Administered 2018-07-11: 50 mg via INTRAVENOUS

## 2018-07-11 MED ORDER — OXYCODONE HCL 5 MG/5ML PO SOLN: 5.0000 mg | Freq: Once | ORAL | Status: DC | PRN

## 2018-07-11 MED ORDER — FENTANYL CITRATE (PF) 250 MCG/5ML IJ SOLN
INTRAMUSCULAR | Status: AC
  Filled 2018-07-11: qty 5

## 2018-07-11 MED ORDER — NICOTINE POLACRILEX 2 MG MT GUM
4.0000 mg | CHEWING_GUM | OROMUCOSAL | Status: DC | PRN
  Filled 2018-07-11: qty 2

## 2018-07-11 MED ORDER — SUGAMMADEX SODIUM 200 MG/2ML IV SOLN
INTRAVENOUS | Status: DC | PRN
  Administered 2018-07-11: 200 mg via INTRAVENOUS

## 2018-07-11 MED ORDER — PROMETHAZINE HCL 25 MG/ML IJ SOLN
6.2500 mg | INTRAMUSCULAR | Status: DC | PRN
  Administered 2018-07-11: 6.25 mg via INTRAVENOUS

## 2018-07-11 MED ORDER — ACETAMINOPHEN 325 MG RE SUPP: 325.0000 mg | RECTAL | Status: DC | PRN

## 2018-07-11 MED ORDER — GUAIFENESIN-DM 100-10 MG/5ML PO SYRP: 15.0000 mL | ORAL_SOLUTION | ORAL | Status: DC | PRN

## 2018-07-11 MED ORDER — DEXAMETHASONE SODIUM PHOSPHATE 10 MG/ML IJ SOLN
INTRAMUSCULAR | Status: DC | PRN
  Administered 2018-07-11: 10 mg via INTRAVENOUS

## 2018-07-11 MED ORDER — ALBUMIN HUMAN 5 % IV SOLN
INTRAVENOUS | Status: DC | PRN
  Administered 2018-07-11: 14:00:00 via INTRAVENOUS

## 2018-07-11 MED ORDER — LABETALOL HCL 5 MG/ML IV SOLN
10.0000 mg | INTRAVENOUS | Status: DC | PRN
  Filled 2018-07-11: qty 4

## 2018-07-11 MED ORDER — ONDANSETRON HCL 4 MG/2ML IJ SOLN
INTRAMUSCULAR | Status: AC
  Filled 2018-07-11: qty 2

## 2018-07-11 MED ORDER — SODIUM CHLORIDE 0.9 % IV SOLN
INTRAVENOUS | Status: DC
  Administered 2018-07-11: 16:00:00 via INTRAVENOUS

## 2018-07-11 MED ORDER — SODIUM CHLORIDE 0.9 % IV SOLN
INTRAVENOUS | Status: DC | PRN
  Administered 2018-07-11: 13:00:00

## 2018-07-11 MED ORDER — MAGNESIUM SULFATE 2 GM/50ML IV SOLN: 2.0000 g | Freq: Every day | INTRAVENOUS | Status: DC | PRN

## 2018-07-11 MED ORDER — VANCOMYCIN HCL 1000 MG IV SOLR
INTRAVENOUS | Status: DC | PRN
  Administered 2018-07-11: 1000 mg via INTRAVENOUS

## 2018-07-11 MED ORDER — B-12 5000 MCG SL SUBL: 5000.0000 ug | SUBLINGUAL_TABLET | Freq: Every day | SUBLINGUAL | Status: DC

## 2018-07-11 MED ORDER — MIDAZOLAM HCL 5 MG/5ML IJ SOLN
INTRAMUSCULAR | Status: DC | PRN
  Administered 2018-07-11: 2 mg via INTRAVENOUS

## 2018-07-11 MED ORDER — ONDANSETRON HCL 4 MG/2ML IJ SOLN: 4.0000 mg | Freq: Four times a day (QID) | INTRAMUSCULAR | Status: DC | PRN

## 2018-07-11 MED ORDER — FENTANYL CITRATE (PF) 100 MCG/2ML IJ SOLN
25.0000 ug | INTRAMUSCULAR | Status: DC | PRN
  Administered 2018-07-11: 50 ug via INTRAVENOUS

## 2018-07-11 MED ORDER — PROMETHAZINE HCL 25 MG/ML IJ SOLN
INTRAMUSCULAR | Status: AC
  Filled 2018-07-11: qty 1

## 2018-07-11 MED ORDER — VITAMIN C 500 MG PO TABS
1000.0000 mg | ORAL_TABLET | Freq: Every day | ORAL | Status: DC
  Administered 2018-07-12: 1000 mg via ORAL
  Filled 2018-07-11: qty 2

## 2018-07-11 MED ORDER — ROCURONIUM BROMIDE 50 MG/5ML IV SOSY
PREFILLED_SYRINGE | INTRAVENOUS | Status: AC
  Filled 2018-07-11: qty 5

## 2018-07-11 MED ORDER — MORPHINE SULFATE (PF) 2 MG/ML IV SOLN: 2.0000 mg | INTRAVENOUS | Status: DC | PRN

## 2018-07-11 MED ORDER — PROPOFOL 10 MG/ML IV BOLUS
INTRAVENOUS | Status: DC | PRN
  Administered 2018-07-11: 140 mg via INTRAVENOUS

## 2018-07-11 MED ORDER — LIDOCAINE 2% (20 MG/ML) 5 ML SYRINGE
INTRAMUSCULAR | Status: AC
  Filled 2018-07-11: qty 5

## 2018-07-11 MED ORDER — PHENYLEPHRINE 40 MCG/ML (10ML) SYRINGE FOR IV PUSH (FOR BLOOD PRESSURE SUPPORT)
PREFILLED_SYRINGE | INTRAVENOUS | Status: AC
  Filled 2018-07-11: qty 10

## 2018-07-11 MED ORDER — ONDANSETRON HCL 4 MG/2ML IJ SOLN
INTRAMUSCULAR | Status: DC | PRN
  Administered 2018-07-11: 4 mg via INTRAVENOUS

## 2018-07-11 MED ORDER — VANCOMYCIN HCL IN DEXTROSE 1-5 GM/200ML-% IV SOLN
1000.0000 mg | INTRAVENOUS | Status: AC
  Administered 2018-07-11: 1000 mg via INTRAVENOUS

## 2018-07-11 MED ORDER — DEXAMETHASONE SODIUM PHOSPHATE 10 MG/ML IJ SOLN
INTRAMUSCULAR | Status: AC
  Filled 2018-07-11: qty 1

## 2018-07-11 MED ORDER — CLINDAMYCIN PHOSPHATE 300 MG/50ML IV SOLN
300.0000 mg | Freq: Two times a day (BID) | INTRAVENOUS | Status: DC
  Administered 2018-07-11: 300 mg via INTRAVENOUS
  Filled 2018-07-11 (×2): qty 50

## 2018-07-11 MED ORDER — OXYCODONE-ACETAMINOPHEN 5-325 MG PO TABS
1.0000 | ORAL_TABLET | ORAL | Status: DC | PRN
  Administered 2018-07-11: 2 via ORAL
  Filled 2018-07-11: qty 2

## 2018-07-11 MED ORDER — SODIUM CHLORIDE 0.9 % IV SOLN: INTRAVENOUS | Status: DC

## 2018-07-11 MED ORDER — LACTATED RINGERS IV SOLN
INTRAVENOUS | Status: DC | PRN
  Administered 2018-07-11 (×2): via INTRAVENOUS

## 2018-07-11 MED ORDER — ASPIRIN EC 325 MG PO TBEC
325.0000 mg | DELAYED_RELEASE_TABLET | Freq: Every day | ORAL | Status: DC
  Administered 2018-07-11 – 2018-07-12 (×2): 325 mg via ORAL
  Filled 2018-07-11 (×2): qty 1

## 2018-07-11 MED ORDER — ACETAMINOPHEN 325 MG PO TABS: 325.0000 mg | ORAL_TABLET | ORAL | Status: DC | PRN

## 2018-07-11 MED ORDER — PHENOL 1.4 % MT LIQD
1.0000 | OROMUCOSAL | Status: DC | PRN
  Administered 2018-07-11: 1 via OROMUCOSAL
  Filled 2018-07-11: qty 177

## 2018-07-11 MED ORDER — SODIUM CHLORIDE 0.9 % IV SOLN
INTRAVENOUS | Status: AC
  Filled 2018-07-11: qty 1.2

## 2018-07-11 MED ORDER — HYDRALAZINE HCL 20 MG/ML IJ SOLN: 5.0000 mg | INTRAMUSCULAR | Status: DC | PRN

## 2018-07-11 MED ORDER — ALUM & MAG HYDROXIDE-SIMETH 200-200-20 MG/5ML PO SUSP: 15.0000 mL | ORAL | Status: DC | PRN

## 2018-07-11 MED ORDER — FENTANYL CITRATE (PF) 100 MCG/2ML IJ SOLN
INTRAMUSCULAR | Status: AC
  Filled 2018-07-11: qty 2

## 2018-07-11 MED ORDER — DOCUSATE SODIUM 100 MG PO CAPS
100.0000 mg | ORAL_CAPSULE | Freq: Every day | ORAL | Status: DC
  Administered 2018-07-12: 100 mg via ORAL
  Filled 2018-07-11: qty 1

## 2018-07-11 MED ORDER — LACTATED RINGERS IV SOLN
INTRAVENOUS | Status: DC
  Administered 2018-07-11: 11:00:00 via INTRAVENOUS

## 2018-07-11 MED FILL — Lidocaine HCl Local Preservative Free (PF) Inj 1%: INTRAMUSCULAR | Qty: 30 | Status: AC

## 2018-07-11 SURGICAL SUPPLY — 56 items
BAG ISOLATION DRAPE 18X18 (DRAPES) ×1 IMPLANT
CANISTER SUCT 3000ML PPV (MISCELLANEOUS) ×2 IMPLANT
CANNULA VESSEL 3MM 2 BLNT TIP (CANNULA) ×4 IMPLANT
CLIP VESOCCLUDE MED 24/CT (CLIP) ×2 IMPLANT
CLIP VESOCCLUDE SM WIDE 24/CT (CLIP) ×2 IMPLANT
COVER WAND RF STERILE (DRAPES) ×2 IMPLANT
DERMABOND ADVANCED (GAUZE/BANDAGES/DRESSINGS) ×2
DERMABOND ADVANCED .7 DNX12 (GAUZE/BANDAGES/DRESSINGS) IMPLANT
DRAIN SNY WOU (WOUND CARE) IMPLANT
DRAPE ISOLATION BAG 18X18 (DRAPES) ×1
ELECT CAUTERY BLADE 6.4 (BLADE) ×1 IMPLANT
ELECT REM PT RETURN 9FT ADLT (ELECTROSURGICAL) ×2
ELECTRODE REM PT RTRN 9FT ADLT (ELECTROSURGICAL) ×1 IMPLANT
EVACUATOR SILICONE 100CC (DRAIN) IMPLANT
GLOVE BIO SURGEON STRL SZ7.5 (GLOVE) ×4 IMPLANT
GLOVE BIOGEL PI IND STRL 6.5 (GLOVE) IMPLANT
GLOVE BIOGEL PI IND STRL 8 (GLOVE) IMPLANT
GLOVE BIOGEL PI INDICATOR 6.5 (GLOVE) ×2
GLOVE BIOGEL PI INDICATOR 8 (GLOVE) ×1
GLOVE SS BIOGEL STRL SZ 7 (GLOVE) IMPLANT
GLOVE SUPERSENSE BIOGEL SZ 7 (GLOVE) ×1
GOWN STRL REUS W/ TWL LRG LVL3 (GOWN DISPOSABLE) ×3 IMPLANT
GOWN STRL REUS W/ TWL XL LVL3 (GOWN DISPOSABLE) IMPLANT
GOWN STRL REUS W/TWL LRG LVL3 (GOWN DISPOSABLE) ×3
GOWN STRL REUS W/TWL XL LVL3 (GOWN DISPOSABLE) ×3
GRAFT HEMASHIELD 8MM (Vascular Products) ×1 IMPLANT
GRAFT VASC STRG 30X8KNIT (Vascular Products) IMPLANT
HEMOSTAT SPONGE AVITENE ULTRA (HEMOSTASIS) IMPLANT
KIT BASIN OR (CUSTOM PROCEDURE TRAY) ×2 IMPLANT
KIT TURNOVER KIT B (KITS) ×2 IMPLANT
LOOP VESSEL MAXI BLUE (MISCELLANEOUS) ×1 IMPLANT
LOOP VESSEL MINI RED (MISCELLANEOUS) ×3 IMPLANT
NS IRRIG 1000ML POUR BTL (IV SOLUTION) ×4 IMPLANT
PACK PERIPHERAL VASCULAR (CUSTOM PROCEDURE TRAY) ×2 IMPLANT
PAD ARMBOARD 7.5X6 YLW CONV (MISCELLANEOUS) ×4 IMPLANT
PENCIL BUTTON HOLSTER BLD 10FT (ELECTRODE) ×1 IMPLANT
SPONGE LAP 18X18 RF (DISPOSABLE) ×1 IMPLANT
STAPLER VISISTAT 35W (STAPLE) IMPLANT
SUT MNCRL AB 4-0 PS2 18 (SUTURE) ×1 IMPLANT
SUT PROLENE 5 0 C 1 24 (SUTURE) ×4 IMPLANT
SUT PROLENE 6 0 CC (SUTURE) ×6 IMPLANT
SUT SILK 3 0 (SUTURE)
SUT SILK 3-0 18XBRD TIE 12 (SUTURE) IMPLANT
SUT VIC AB 2-0 CT1 27 (SUTURE) ×1
SUT VIC AB 2-0 CT1 TAPERPNT 27 (SUTURE) IMPLANT
SUT VIC AB 2-0 SH 27 (SUTURE) ×2
SUT VIC AB 2-0 SH 27XBRD (SUTURE) ×2 IMPLANT
SUT VIC AB 3-0 SH 27 (SUTURE) ×2
SUT VIC AB 3-0 SH 27X BRD (SUTURE) ×2 IMPLANT
SUT VICRYL 4-0 PS2 18IN ABS (SUTURE) ×4 IMPLANT
TAPE UMBILICAL COTTON 1/8X30 (MISCELLANEOUS) IMPLANT
TOWEL GREEN STERILE (TOWEL DISPOSABLE) ×2 IMPLANT
TRAY FOLEY MTR SLVR 16FR STAT (SET/KITS/TRAYS/PACK) ×2 IMPLANT
TUBE CONNECTING 12X1/4 (SUCTIONS) ×2 IMPLANT
UNDERPAD 30X30 (UNDERPADS AND DIAPERS) ×2 IMPLANT
WATER STERILE IRR 1000ML POUR (IV SOLUTION) ×2 IMPLANT

## 2018-07-11 NOTE — Plan of Care (Signed)
  Problem: Education: Goal: Knowledge of General Education information will improve Description Including pain rating scale, medication(s)/side effects and non-pharmacologic comfort measures Outcome: Progressing   Problem: Health Behavior/Discharge Planning: Goal: Ability to manage health-related needs will improve Outcome: Progressing   

## 2018-07-11 NOTE — Progress Notes (Signed)
Pt arrived to 4e from PACU. Vitals obtained and stable. Telemetry box applied and CCMD notified. Bilateral groin sites level 0. Foley and a-line in place. Will continue current plan of care.

## 2018-07-11 NOTE — Anesthesia Postprocedure Evaluation (Signed)
Anesthesia Post Note  Patient: Domenic Distasio Marquess  Procedure(s) Performed: LEFT FEMORAL TO RIGHT FEMORAL ARTERY BYPASS USING 75mm HEMASHIELD GOLD GRAFT (Bilateral )     Patient location during evaluation: PACU Anesthesia Type: General Level of consciousness: awake and alert Pain management: pain level controlled Vital Signs Assessment: post-procedure vital signs reviewed and stable Respiratory status: spontaneous breathing, nonlabored ventilation and respiratory function stable Cardiovascular status: blood pressure returned to baseline and stable Postop Assessment: no apparent nausea or vomiting Anesthetic complications: no    Last Vitals:  Vitals:   07/11/18 1608 07/11/18 1629  BP: 104/67 106/74  Pulse: 100 95  Resp: (!) 24   Temp: 36.7 C 36.6 C  SpO2: 100% 100%    Last Pain:  Vitals:   07/11/18 1629  TempSrc: Oral  PainSc:                  Beryle Lathe

## 2018-07-11 NOTE — Anesthesia Preprocedure Evaluation (Addendum)
Anesthesia Evaluation  Patient identified by MRN, date of birth, ID band Patient awake    Reviewed: Allergy & Precautions, NPO status , Patient's Chart, lab work & pertinent test results  History of Anesthesia Complications Negative for: history of anesthetic complications  Airway Mallampati: II  TM Distance: >3 FB Neck ROM: Full    Dental  (+) Dental Advisory Given, Teeth Intact   Pulmonary Current Smoker,   Childhood asthma    breath sounds clear to auscultation       Cardiovascular hypertension, + Peripheral Vascular Disease   Rhythm:Regular Rate:Normal     Neuro/Psych negative neurological ROS  negative psych ROS   GI/Hepatic Neg liver ROS, GERD  Medicated and Controlled,  Endo/Other  negative endocrine ROS  Renal/GU negative Renal ROS     Musculoskeletal negative musculoskeletal ROS (+)   Abdominal   Peds  Hematology negative hematology ROS (+)   Anesthesia Other Findings   Reproductive/Obstetrics                           Anesthesia Physical Anesthesia Plan  ASA: III  Anesthesia Plan: General   Post-op Pain Management:    Induction: Intravenous  PONV Risk Score and Plan: 3 and Treatment may vary due to age or medical condition, Ondansetron, Dexamethasone and Midazolam  Airway Management Planned: Oral ETT  Additional Equipment: Arterial line  Intra-op Plan:   Post-operative Plan: Extubation in OR  Informed Consent: I have reviewed the patients History and Physical, chart, labs and discussed the procedure including the risks, benefits and alternatives for the proposed anesthesia with the patient or authorized representative who has indicated his/her understanding and acceptance.     Dental advisory given  Plan Discussed with: CRNA and Anesthesiologist  Anesthesia Plan Comments:        Anesthesia Quick Evaluation

## 2018-07-11 NOTE — Op Note (Signed)
Procedure: Left to right femoral-femoral bypass  Preoperative diagnosis: lower extremity claudication  Postoperative diagnosis: Same  Anesthesia: Gen.  Assistant: Clotilde Dieter MD, [facility] PA-C, Lemmie Evens NP  Upper findings: 8 mm Dacron graft  High femoral bifurcation on left side at inguinal ligament proximal anastomosis is from the proximal SFA  Operative details: After obtaining informed consent, the patient was taken to the operating room. The patient was placed in supine position on theoperating room table. After induction of general anesthesia, a Foley catheter was placed. Next the patient was prepped and draped in the usual sterile fashion from the umbilicus to the toes. A longitudinal incision was made in the left groin.  There was some blood staining of tissues due to recent arteriogram.  The patients profunda came off right at the inguinal ligament and there was some bleeding from the common femoral artery when this was unroofed.  It was repaired with a single 6 0 prolene suture. The profunda femoris and superficial femoral artery dissected free circumferentially.  They were soft. The native common femoral artery was also dissected free circumferentially Vessel loops were placed around all these.  There was a good pulse in the left common femoral SFA and profunda. Attention was then turned to the right groin. In similar fashion a longitudinal incision was made in the right groin. There was also some blood staining of tissues due to recent arteriogram.  The native common femoral was dissected free circumferentially. The profunda femoris and superficial femoral arteries were dissected free circumferentially. Vessel loops were placed around all these. There was no pulse in the right common femoral.   Next a subcutaneous tunnel was created over the suprapubic region connecting the left and right groin incisions. An 8 mm Dacron graft was brought through this. The patient was given 8000 units  of intravenous heparin. The left common femoral profunda and SFA and profunda were controlled with vessel loops.  A longitudinal incision was made in the SFA just below the profunda.   The new 8 mm Dacron graft was spatulated and sewn end of graft to side of the SFA using a running 6 0 prolene suture. Just prior to completion of the anastomosis, it was forebled backbled and thoroughly flushed the anastomosis was secured and clamps released and there was good pulsatile flow in the graft immediately. Hemostasis was obtained. Attention was then turned to the right groin. The fem fem was clamped with a Henley clamp.  The native common femoral artery was controlled proximally with a vessel loop as well as the SFA and profunda.  A longitudinal opening was made in the right common femoral artery.  The new 8 mm Dacron graft was cut to length and beveled. This was then sewn end of graft to side of native common femoral artery using a running 6-0 Prolene suture. Just prior to completion of the anastomosis it was forebled backbled and thoroughly flushed. the anastomosis was secured; clamps released; and there was pulsatile flow in the graft immediately. A few repair sutures were placed near the heel of the anastomosis.  The patient had good posterior tibial and dorsalis pedis Doppler signals bilaterally. Posterior tibial Doppler signals were more dominant. There was also good pulsatile flow in the distal superficial femoral artery at the level of the groin. The patient was given 50 mg of protamine for heparin reversal. Hemostasis was obtained.  The groin was then closed in multiple layers using running 2 0 and 3 0 Vicryl suture. The skin of  both incisions was closed with a 4 0 Vicryl subcuticular stitch. Dermabond was applied to both incisions. The patient tolerated procedure well and there were no complications. The patient was extubated in the operating room and taken to recovery in stable condition.  Fabienne Bruns,  MD [facility] [place] Office: [phone] Pager: [phone]

## 2018-07-11 NOTE — Progress Notes (Signed)
Pt awake in PACU  Vitals:   07/11/18 1510 07/11/18 1511 07/11/18 1526 07/11/18 1541  BP:  104/63 102/69 94/66  Pulse: (!) 109 (!) 104 (!) 108 (!) 101  Resp: (!) 22 16 (!) 23 (!) 29  Temp: 98 F (36.7 C)     TempSrc:      SpO2: 97% 96% 95% 100%  Weight:      Height:        1+ PT pulses bilaterally No groin hematoma  To 4 E when bed available  Fabienne Bruns, MD [facility] [place] Office: [phone] Pager: [phone]

## 2018-07-11 NOTE — Anesthesia Procedure Notes (Signed)
Arterial Line Insertion Start/End2/24/2020 12:05 PM, 07/11/2018 12:15 PM Performed by: Rosalio Macadamia, CRNA, CRNA  Patient location: Pre-op. Preanesthetic checklist: patient identified, IV checked, site marked, risks and benefits discussed, surgical consent, monitors and equipment checked, pre-op evaluation and anesthesia consent Lidocaine 1% used for infiltration Right, radial was placed Catheter size: 20 G Hand hygiene performed  and maximum sterile barriers used   Attempts: 1 Procedure performed without using ultrasound guided technique. Ultrasound Notes:anatomy identified, needle tip was noted to be adjacent to the nerve/plexus identified and no ultrasound evidence of intravascular and/or intraneural injection Following insertion, dressing applied and Biopatch. Post procedure assessment: normal  Patient tolerated the procedure well with no immediate complications.

## 2018-07-11 NOTE — Anesthesia Procedure Notes (Signed)
Procedure Name: Intubation Date/Time: 07/11/2018 12:33 PM Performed by: Nils Pyle, CRNA Pre-anesthesia Checklist: Patient identified, Emergency Drugs available, Suction available and Patient being monitored Patient Re-evaluated:Patient Re-evaluated prior to induction Oxygen Delivery Method: Circle System Utilized Preoxygenation: Pre-oxygenation with 100% oxygen Induction Type: IV induction Ventilation: Mask ventilation without difficulty Laryngoscope Size: Miller and 2 Grade View: Grade II Tube type: Oral Tube size: 7.0 mm Number of attempts: 1 Airway Equipment and Method: Stylet and Oral airway Placement Confirmation: ETT inserted through vocal cords under direct vision,  positive ETCO2 and breath sounds checked- equal and bilateral Secured at: 23 cm Tube secured with: Tape Dental Injury: Teeth and Oropharynx as per pre-operative assessment  Comments: Intubation by Ulla Potash, SRNA

## 2018-07-11 NOTE — Interval H&P Note (Signed)
History and Physical Interval Note:  07/11/2018 11:41 AM  Martin Soto  has presented today for surgery, with the diagnosis of PERIPHERAL ARTERY DISEASE  The various methods of treatment have been discussed with the patient and family. After consideration of risks, benefits and other options for treatment, the patient has consented to  Procedure(s): LEFT FEMORAL TO RIGHT FEMORAL ARTERY BYPASS GRAFT (Left) as a surgical intervention .  The patient's history has been reviewed, patient examined, no change in status, stable for surgery.  I have reviewed the patient's chart and labs.  Questions were answered to the patient's satisfaction.     Fabienne Bruns

## 2018-07-11 NOTE — Transfer of Care (Signed)
Immediate Anesthesia Transfer of Care Note  Patient: Martin Soto  Procedure(s) Performed: LEFT FEMORAL TO RIGHT FEMORAL ARTERY BYPASS USING 57mm HEMASHIELD GOLD GRAFT (Bilateral )  Patient Location: PACU  Anesthesia Type:General  Level of Consciousness: awake and patient cooperative  Airway & Oxygen Therapy: Patient Spontanous Breathing and Patient connected to nasal cannula oxygen  Post-op Assessment: Report given to RN, Post -op Vital signs reviewed and stable and Patient moving all extremities  Post vital signs: Reviewed and stable  Last Vitals:  Vitals Value Taken Time  BP    Temp    Pulse 112 07/11/2018  3:09 PM  Resp 14 07/11/2018  3:09 PM  SpO2 98 % 07/11/2018  3:09 PM  Vitals shown include unvalidated device data.  Last Pain:  Vitals:   07/11/18 1042  TempSrc:   PainSc: 2          Complications: No apparent anesthesia complications

## 2018-07-12 ENCOUNTER — Encounter (HOSPITAL_COMMUNITY): Payer: Self-pay | Admitting: Vascular Surgery

## 2018-07-12 ENCOUNTER — Encounter (HOSPITAL_COMMUNITY): Payer: Managed Care, Other (non HMO)

## 2018-07-12 LAB — CBC
HCT: 33.3 % — ABNORMAL LOW (ref 39.0–52.0)
Hemoglobin: 11.6 g/dL — ABNORMAL LOW (ref 13.0–17.0)
MCH: 34.2 pg — ABNORMAL HIGH (ref 26.0–34.0)
MCHC: 34.8 g/dL (ref 30.0–36.0)
MCV: 98.2 fL (ref 80.0–100.0)
Platelets: 150 10*3/uL (ref 150–400)
RBC: 3.39 MIL/uL — AB (ref 4.22–5.81)
RDW: 12.3 % (ref 11.5–15.5)
WBC: 12.7 10*3/uL — ABNORMAL HIGH (ref 4.0–10.5)
nRBC: 0 % (ref 0.0–0.2)

## 2018-07-12 LAB — BASIC METABOLIC PANEL
Anion gap: 5 (ref 5–15)
BUN: 10 mg/dL (ref 8–23)
CO2: 24 mmol/L (ref 22–32)
Calcium: 8.9 mg/dL (ref 8.9–10.3)
Chloride: 107 mmol/L (ref 98–111)
Creatinine, Ser: 0.85 mg/dL (ref 0.61–1.24)
GFR calc Af Amer: >60 mL/min (ref >60)
GFR calc non Af Amer: >60 mL/min (ref >60)
Glucose, Bld: 163 mg/dL — ABNORMAL HIGH (ref 70–99)
Potassium: 4.1 mmol/L (ref 3.5–5.1)
Sodium: 136 mmol/L (ref 135–145)

## 2018-07-12 MED ORDER — OXYCODONE-ACETAMINOPHEN 5-325 MG PO TABS: 1.0000 | ORAL_TABLET | Freq: Four times a day (QID) | ORAL | 0 refills | Status: DC | PRN

## 2018-07-12 NOTE — Treatment Plan (Signed)
   [address]    [place], [place] [address]    [phone]   To whom it may concern,  Mr. Peterjohn Stavig will be able to return to work, light-duty only, 3/2-07/24/18.  He will be able to return to full-duty without restrictions on 07/25/18.  Thanks,     Emilie Rutter, PA-C [facility] [phone] 07/12/2018  8:30 AM

## 2018-07-12 NOTE — Evaluation (Addendum)
Physical Therapy Evaluation Patient Details Name: Martin Soto MRN: [id number] DOB: [birth year] Today's Date: 07/12/2018   History of Present Illness  63 yo male s/p left to right femoral-femoral bypass. PMH including Asthma, Chest wall syndrome (02/2002), Dyspnea, GERD, HTN, and PAD.  Clinical Impression  Patient seen for therapy assessment.  Mobilizing well with no noted focal deficits at this time. Educated patient on activity expectations, positioning and LE elevation. No further acute PT needs. Will sign off. OF NOTE: patient with HR elevation to min 140s with activity, decreased to 120s  With rest, nsg aware    Follow Up Recommendations No PT follow up    Equipment Recommendations  None recommended by PT    Recommendations for Other Services       Precautions / Restrictions Precautions Precaution Comments: watch HR      Mobility  Bed Mobility Overal bed mobility: Modified Independent             General bed mobility comments: Increased time as needed  Transfers Overall transfer level: Needs assistance   Transfers: Sit to/from Stand Sit to Stand: Supervision         General transfer comment: Supervision for safety  Ambulation/Gait Ambulation/Gait assistance: Supervision Gait Distance (Feet): 340 Feet Assistive device: None Gait Pattern/deviations: WFL(Within Functional Limits) Gait velocity: impulsively Gait velocity interpretation: >4.37 ft/sec, indicative of normal walking speed General Gait Details: patient slightly impulsive, requires cues for relaxation and safety HR elevation to mid 140s and sustaining, nsg aware  Stairs Stairs: Yes Stairs assistance: Modified independent (Device/Increase time) Stair Management: One rail Left Number of Stairs: 12 General stair comments: Cues to slow down for safety  Wheelchair Mobility    Modified Rankin (Stroke Patients Only)       Balance Overall balance assessment: Mild deficits observed, not  formally tested                                           Pertinent Vitals/Pain Pain Assessment: Faces Faces Pain Scale: Hurts a little bit Pain Location: LLE Pain Descriptors / Indicators: Constant;Discomfort Pain Intervention(s): Monitored during session;Repositioned    Home Living Family/patient expects to be discharged to:: Private residence Living Arrangements: Alone Available Help at Discharge: Family;Available 24 hours/day(Staying at his brother's house at dc) Type of Home: House Home Access: Ramped entrance     Home Layout: One level Home Equipment: Shower seat Additional Comments: Above information for brother's home.     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        Extremity/Trunk Assessment   Upper Extremity Assessment Upper Extremity Assessment: Overall WFL for tasks assessed    Lower Extremity Assessment Lower Extremity Assessment: Defer to PT evaluation    Cervical / Trunk Assessment Cervical / Trunk Assessment: Normal  Communication   Communication: No difficulties  Cognition Arousal/Alertness: Awake/alert Behavior During Therapy: WFL for tasks assessed/performed Overall Cognitive Status: Within Functional Limits for tasks assessed                                        General Comments      Exercises     Assessment/Plan    PT Assessment Patent does  not need any further PT services  PT Problem List         PT Treatment Interventions      PT Goals (Current goals can be found in the Care Plan section)  Acute Rehab PT Goals Patient Stated Goal: "Return to playing golf" PT Goal Formulation: All assessment and education complete, DC therapy    Frequency     Barriers to discharge        Co-evaluation               AM-PAC PT "6 Clicks" Mobility  Outcome Measure Help needed turning from your back to your  side while in a flat bed without using bedrails?: None Help needed moving from lying on your back to sitting on the side of a flat bed without using bedrails?: None Help needed moving to and from a bed to a chair (including a wheelchair)?: None Help needed standing up from a chair using your arms (e.g., wheelchair or bedside chair)?: None Help needed to walk in hospital room?: None Help needed climbing 3-5 steps with a railing? : None 6 Click Score: 24    End of Session   Activity Tolerance: Patient tolerated treatment well Patient left: in bed;with call bell/phone within reach Nurse Communication: Mobility status(HR elevation) PT Visit Diagnosis: Pain Pain - Right/Left: Left Pain - part of body: Leg    Time: 3300-7622 PT Time Calculation (min) (ACUTE ONLY): 10 min   Charges:   PT Evaluation $PT Eval Low Complexity: 1 Low          Charlotte Crumb, PT DPT  Board Certified Neurologic Specialist Acute Rehabilitation Services Pager [phone] Office [phone]   Fabio Asa 07/12/2018, 9:20 AM

## 2018-07-12 NOTE — Progress Notes (Addendum)
  Progress Note    07/12/2018 8:34 AM 1 Day Post-Op  Subjective:  No complaints.  Would like to be discharged this afternoon.   Vitals:   07/11/18 2020 07/12/18 0443  BP: 131/77 124/82  Pulse: (!) 115 93  Resp: (!) 21 15  Temp:  98.3 F (36.8 C)  SpO2: 100% 100%   Physical Exam: Lungs:  Non labored Incisions:  Groin incisions are c/d/i; ecchymosis L thigh Extremities:  Palpable L DP; brisk R ATA and PTA by doppler Abdomen:  Soft Neurologic: A&O  CBC    Component Value Date/Time   WBC 12.7 (H) 07/12/2018 0500   RBC 3.39 (L) 07/12/2018 0500   HGB 11.6 (L) 07/12/2018 0500   HCT 33.3 (L) 07/12/2018 0500   PLT 150 07/12/2018 0500   MCV 98.2 07/12/2018 0500   MCH 34.2 (H) 07/12/2018 0500   MCHC 34.8 07/12/2018 0500   RDW 12.3 07/12/2018 0500   LYMPHSABS 5.4 (H) 10/18/2006 1442   MONOABS 0.5 10/18/2006 1442   EOSABS 0.1 10/18/2006 1442   BASOSABS 0.1 10/18/2006 1442    BMET    Component Value Date/Time   NA 138 07/08/2018 1530   K 3.7 07/08/2018 1530   CL 108 07/08/2018 1530   CO2 22 07/08/2018 1530   GLUCOSE 121 (H) 07/08/2018 1530   BUN 14 07/08/2018 1530   CREATININE 0.86 07/08/2018 1530   CALCIUM 8.8 (L) 07/08/2018 1530   GFRNONAA >60 07/08/2018 1530   GFRAA >60 07/08/2018 1530    INR    Component Value Date/Time   INR 1.10 07/08/2018 1530     Intake/Output Summary (Last 24 hours) at 07/12/2018 7591 Last data filed at 07/12/2018 0600 Gross per 24 hour  Intake 3364.17 ml  Output 3095 ml  Net 269.17 ml     Assessment/Plan:  63 y.o. male is s/p L to R femoral to femoral bypass 1 Day Post-Op    Perfusing BLE well with palpable L DP and brisk doppler signals R ATA and PTA Ambulating without difficulty D/c home this afternoon Work note on paper chart Follow up with Dr. Darrick Penna in office in 2 weeks   Emilie Rutter, PA-C [facility] [phone] 07/12/2018 8:34 AM  Palpable pedal pulses both feet Incisions clean no  hematoma D/c home today  Needs note for work light duty starting next week full duty the following week  Fabienne Bruns, MD [facility] [place] Office: [phone] Pager: [phone]

## 2018-07-12 NOTE — Discharge Instructions (Signed)
 [facility] [place]  Discharge instructions  Lower Extremity Bypass Surgery  Please refer to the following instruction for your post-procedure care. Your surgeon or physician assistant will discuss any changes with you.  Activity  You are encouraged to walk as much as you can. You can slowly return to normal activities during the month after your surgery. Avoid strenuous activity and heavy lifting until your doctor tells you it's OK. Avoid activities such as vacuuming or swinging a golf club. Do not drive until your doctor give the OK and you are no longer taking prescription pain medications. It is also normal to have difficulty with sleep habits, eating and bowel movement after surgery. These will go away with time.  Bathing/Showering  You may shower after you go home. Do not soak in a bathtub, hot tub, or swim until the incision heals completely.  Incision Care  Clean your incision with mild soap and water. Shower every day. Pat the area dry with a clean towel. You do not need a bandage unless otherwise instructed. Do not apply any ointments or creams to your incision. If you have open wounds you will be instructed how to care for them or a visiting nurse may be arranged for you. If you have staples or sutures along your incision they will be removed at your post-op appointment. You may have skin glue on your incision. Do not peel it off. It will come off on its own in about one week. If you have a great deal of moisture in your groin, use a gauze help keep this area dry.  Diet  Resume your normal diet. There are no special food restrictions following this procedure. A low fat/ low cholesterol diet is recommended for all patients with vascular disease. In order to heal from your surgery, it is CRITICAL to get adequate nutrition. Your body requires vitamins, minerals, and protein. Vegetables are the best source of vitamins and minerals. Vegetables also provide the  perfect balance of protein. Processed food has little nutritional value, so try to avoid this.  Medications  Resume taking all your medications unless your doctor or nurse practitioner tells you not to. If your incision is causing pain, you may take over-the-counter pain relievers such as acetaminophen (Tylenol). If you were prescribed a stronger pain medication, please aware these medication can cause nausea and constipation. Prevent nausea by taking the medication with a snack or meal. Avoid constipation by drinking plenty of fluids and eating foods with high amount of fiber, such as fruits, vegetables, and grains. Take Colase 100 mg (an over-the-counter stool softener) twice a day as needed for constipation. Do not take Tylenol if you are taking prescription pain medications.  Follow Up  Our office will schedule a follow up appointment 2-3 weeks following discharge.  Please call us immediately for any of the following conditions  Severe or worsening pain in your legs or feet while at rest or while walking Increase pain, redness, warmth, or drainage (pus) from your incision site(s) Fever of 101 degree or higher The swelling in your leg with the bypass suddenly worsens and becomes more painful than when you were in the hospital If you have been instructed to feel your graft pulse then you should do so every day. If you can no longer feel this pulse, call the office immediately. Not all patients are given this instruction.  Leg swelling is common after leg bypass surgery.  The swelling should improve over a few months   following surgery. To improve the swelling, you may elevate your legs above the level of your heart while you are sitting or resting. Your surgeon or physician assistant may ask you to apply an ACE wrap or wear compression (TED) stockings to help to reduce swelling.  Reduce your risk of vascular disease  Stop smoking. If you would like help call [facility] at 1-800-QUIT-NOW  (1-800-784-8669) or [place] at 336-586-4000.  Manage your cholesterol Maintain a desired weight Control your diabetes weight Control your diabetes Keep your blood pressure down  If you have any questions, please call the office at [phone]   

## 2018-07-12 NOTE — Care Management Note (Signed)
Case Management Note Donn Pierini RN, BSN Transitions of Care Unit 4E- RN Case Manager [phone]  Patient Details  Name: Martin Soto MRN: [id number] Date of Birth: Nov 07, 1955  Subjective/Objective:   Pt admitted s/p femfem bypass                 Action/Plan: PTA pt lived at home, plan to return home, per PT/OT evals no f/u recommendations made. CM noted no transition needs for transition home.   Expected Discharge Date:  07/12/18               Expected Discharge Plan:  Home/Self Care  In-House Referral:  NA  Discharge planning Services  CM Consult  Post Acute Care Choice:    Choice offered to:  NA  DME Arranged:    DME Agency:     HH Arranged:    HH Agency:     Status of Service:  Completed, signed off  If discussed at Long Length of Stay Meetings, dates discussed:    Discharge Disposition: home/self care    Additional Comments:  Darrold Span, RN 07/12/2018, 11:58 AM

## 2018-07-12 NOTE — Progress Notes (Signed)
PT iv removed and intact. CCMD notified/ telebox removed. Pt provided discharge instructions and paperwork. Pt has all belongings. Volunteers called to tx pt via wheelchair to meet ride. Lacy Duverney, RN

## 2018-07-12 NOTE — Evaluation (Addendum)
Occupational Therapy Evaluation and Discharge after Second Visit. Patient Details Name: Martin Soto MRN: [id number] DOB: February 27, 1956 Today's Date: 07/12/2018    History of Present Illness 63 yo male s/p left to right femoral-femoral bypass. PMH including Asthma, Chest wall syndrome (02/2002), Dyspnea, GERD, HTN, and PAD.     Clinical Impression   PTA, pt living alone and was independent. Pt planning on dc to his brother's home. Pt currently performing ADLs at Mod I level with increased time as needed. Educated pt on compensatory techniques for ADLs and functional transfer; pt demonstrated and verbalized understanding. Recommend dc home once medically stable. All acute OT needs met and will sign off. Thank you.  Returned for second visit ('@8'$ :47) to address dressing and supervise for safety. Pt donning pants, shirt, socks, and shoes at Mod I level and continues to require increased time and effort. Pt HR elevating to 140s during dressing; taking rest break and HR returning to 110s.    Follow Up Recommendations  No OT follow up    Equipment Recommendations  None recommended by OT    Recommendations for Other Services PT consult     Precautions / Restrictions        Mobility Bed Mobility Overal bed mobility: Modified Independent             General bed mobility comments: Increased time as needed  Transfers Overall transfer level: Needs assistance   Transfers: Sit to/from Stand Sit to Stand: Supervision         General transfer comment: Supervision for safety    Balance Overall balance assessment: Mild deficits observed, not formally tested                                         ADL either performed or assessed with clinical judgement   ADL Overall ADL's : Modified independent                                       General ADL Comments: Pt performing ADLs with increased time as needed. Educated pt on compensatory techniques  for LB ADLs and functional transfers. Pt verablized and demonstrated understanding     Vision         Perception     Praxis      Pertinent Vitals/Pain Pain Assessment: Faces Faces Pain Scale: Hurts a little bit Pain Location: LLE Pain Descriptors / Indicators: Constant;Discomfort Pain Intervention(s): Monitored during session;Repositioned     Hand Dominance     Extremity/Trunk Assessment Upper Extremity Assessment Upper Extremity Assessment: Overall WFL for tasks assessed   Lower Extremity Assessment Lower Extremity Assessment: Defer to PT evaluation   Cervical / Trunk Assessment Cervical / Trunk Assessment: Normal   Communication Communication Communication: No difficulties   Cognition Arousal/Alertness: Awake/alert Behavior During Therapy: WFL for tasks assessed/performed Overall Cognitive Status: Within Functional Limits for tasks assessed                                     General Comments       Exercises     Shoulder Instructions      Home Living Family/patient expects to be discharged to:: Private residence Living Arrangements: Alone Available Help at Discharge: Family;Available 24  hours/day(Staying at his brother's house at dc) Type of Home: House Home Access: [place]: One level     Bathroom Shower/Tub: Occupational psychologist: Handicapped height     Home Equipment: Shower seat   Additional Comments: Above information for brother's home.       Prior Functioning/Environment Level of Independence: Independent        Comments: Enjoys fishing and playing golf        OT Problem List: Decreased activity tolerance;Impaired balance (sitting and/or standing);Decreased knowledge of use of DME or AE;Decreased knowledge of precautions;Pain      OT Treatment/Interventions:      OT Goals(Current goals can be found in the care plan section) Acute Rehab OT Goals Patient Stated Goal: "Return to  playing golf" OT Goal Formulation: All assessment and education complete, DC therapy  OT Frequency:     Barriers to D/C:            Co-evaluation              AM-PAC OT "6 Clicks" Daily Activity     Outcome Measure Help from another person eating meals?: None Help from another person taking care of personal grooming?: None Help from another person toileting, which includes using toliet, bedpan, or urinal?: None Help from another person bathing (including washing, rinsing, drying)?: None Help from another person to put on and taking off regular upper body clothing?: None Help from another person to put on and taking off regular lower body clothing?: None 6 Click Score: 24   End of Session Nurse Communication: Mobility status  Activity Tolerance: Patient tolerated treatment well Patient left: in chair;with call bell/phone within reach;with nursing/sitter in room  OT Visit Diagnosis: Unsteadiness on feet (R26.81);Other abnormalities of gait and mobility (R26.89)                Time: 1610-9604 OT Time Calculation (min): 16 min Charges:  OT General Charges $OT Visit: 2 Visit OT Evaluation $OT Eval Low Complexity: 1 Low $Self Care/Home Management: 8-22 mins  Lunna Vogelgesang MSOT, OTR/L Acute Rehab Pager: [phone] Office: [place] 07/12/2018, 8:41 AM

## 2018-07-13 NOTE — Discharge Summary (Signed)
Physician Discharge Summary   Patient ID: Martin Soto [id number] 63 y.o. 04-Sep-1955  Admit date: 07/11/2018  Discharge date and time: 07/12/2018 12:26 PM   Admitting Physician: Sherren Kerns, MD   Discharge Physician: same  Admission Diagnoses: PERIPHERAL ARTERY DISEASE  Discharge Diagnoses: same  Admission Condition: fair  Discharged Condition: fair  Indication for Admission: Debilitating claudication  Hospital Course: Mr. Martin Soto is a 63 year old male who underwent left common iliac artery stenting last week.  He was brought back for left to right femoral to femoral bypass with known occlusion of right iliac artery system.  This was performed by Dr. Darrick Penna on 07/11/2018.  He tolerated the procedure well and was admitted to the hospital postoperatively.  POD #1 he was ambulating without difficulty, tolerating a regular diet, and feeling ready for discharge home.  Work note will be provided to begin light duty on 3/2 and can progress to full duty without restrictions on 07/25/2018.  He will continue his aspirin and statin regimen daily.  At the time of discharge she had a palpable DP pulse bilaterally.  He will follow-up in office to see Dr. Darrick Penna.  He will be prescribed 2 to 3 days of narcotic pain medication for continued postoperative pain control.  Discharge instructions were reviewed with the patient and he voiced his understanding.  He will be discharged home in stable condition.  Consults: None  Treatments: surgery: Left to right femoral to femoral bypass by Dr. Darrick Penna on 07/11/2018  Discharge Exam: See progress note 07/12/2018 Vitals:   07/12/18 0443 07/12/18 0939  BP: 124/82 134/76  Pulse: 93   Resp: 15 15  Temp: 98.3 F (36.8 C) 97.7 F (36.5 C)  SpO2: 100%      Disposition: Home  - For VQI Registry use ---  Post-op:  Wound infection: No  Graft infection: No  Transfusion: No  If yes,  units given New Arrhythmia: No Ipsilateral amputation: [x ] no, [ ]   Minor, [ ]  BKA, [ ]  AKA Discharge patency: [x ] Primary, [ ]  Primary assisted, [ ]  Secondary, [ ]  Occluded Patency judged by: [ ]  Dopper only, [ ]  Palpable graft pulse, [x ] Palpable distal pulse, [ ]  ABI inc. > 0.15, [ ]  Duplex D/C Ambulatory Status: Ambulatory  Complications: MI: [ x] No, [ ]  Troponin only, [ ]  EKG or Clinical CHF: No Resp failure: [ x] none, [ ]  Pneumonia, [ ]  Ventilator Chg in renal function: [ x] none, [ ]  Inc. Cr > 0.5, [ ]  Temp. Dialysis, [ ]  Permanent dialysis Stroke: [ x] None, [ ]  Minor, [ ]  Major Return to OR: No  Reason for return to OR: [ ]  Bleeding, [ ]  Infection, [ ]  Thrombosis, [ ]  Revision  Discharge medications: Statin use:  Yes ASA use:  Yes Plavix use:  No  for medical reason not indicated Beta blocker use: No  for medical reason not indicated Coumadin use: No  for medical reason not indicated    Patient Instructions:  Allergies as of 07/12/2018      Reactions   Lisinopril-hydrochlorothiazide Nausea And Vomiting   Headache, dizzy, blurred vision, toes numb   Penicillins    Heart Stopped as a child while in the hospital  Did it involve swelling of the face/tongue/throat, SOB, or low BP? Yes Did it involve sudden or severe rash/hives, skin peeling, or any reaction on the inside of your mouth or nose? No Did you need to seek medical attention at a hospital  or doctor's office? Yes When did it last happen?childhood allergy If all above answers are "NO", may proceed with cephalosporin use.      Medication List    TAKE these medications   aspirin EC 325 MG tablet Take 325 mg by mouth daily.   B-12 5000 MCG Subl Place 5,000 mcg under the tongue daily.   ibuprofen 200 MG tablet Commonly known as:  ADVIL,MOTRIN Take 400 mg by mouth every 6 (six) hours as needed for headache or moderate pain.   IBUPROFEN PM 200-38 MG Tabs Generic drug:  Ibuprofen-diphenhydrAMINE Cit Take 1 tablet by mouth at bedtime.   MULTI VITAMIN MENS PO Take 1  tablet by mouth daily.   nicotine polacrilex 4 MG gum Commonly known as:  NICORETTE Take 4 mg by mouth as needed for smoking cessation.   oxyCODONE-acetaminophen 5-325 MG tablet Commonly known as:  PERCOCET/ROXICET Take 1 tablet by mouth every 6 (six) hours as needed for moderate pain.   ranitidine 150 MG capsule Commonly known as:  ZANTAC Take 150 mg by mouth daily as needed.   simvastatin 10 MG tablet Commonly known as:  ZOCOR Take 1 tablet (10 mg total) by mouth at bedtime. What changed:  when to take this   Vitamin C 500 MG Chew Chew 1,000 mg by mouth daily.      Activity: activity as tolerated Diet: regular diet Wound Care: keep wound clean and dry  Follow-up with Dr. Darrick Penna in 2 weeks.  SignedEmilie Rutter 07/13/2018 8:25 AM

## 2018-07-14 ENCOUNTER — Telehealth: Payer: Self-pay | Admitting: *Deleted

## 2018-07-14 NOTE — Telephone Encounter (Signed)
Patient called c/o swelling and bruising groin and scrotum which he states decreases overnight. He denies pain leg pain. States toes on left foot are pink and warm. Reviewed signs of ischemia and to report to [facility] ER for worsening or acute condition. Instructed to lie down in bed more than sitting in recliner to help reduce swelling in scrotum. He agrees to call back on Monday if swelling not improved.

## 2018-07-15 ENCOUNTER — Telehealth: Payer: Self-pay | Admitting: Vascular Surgery

## 2018-07-15 NOTE — Telephone Encounter (Signed)
-----   Message from Carnella Guadalajara sent at 07/13/2018  9:27 AM EST -----   ----- Message ----- From: Forestine Na Sent: 07/12/2018   8:42 AM EST To: [facility] Admin [facility], [facility] Charge [facility]   Can you schedule an appt for this pt in 2 weeks to see Dr. Darrick Penna.  PO L to R femoral to femoral bypass. Thanks, [employer]

## 2018-07-15 NOTE — Telephone Encounter (Signed)
sch appt apk to pt [facility]ld ltr 07/28/2018 345 p/o MD

## 2018-07-28 ENCOUNTER — Encounter: Payer: Self-pay | Admitting: Vascular Surgery

## 2018-07-28 ENCOUNTER — Other Ambulatory Visit: Payer: Self-pay

## 2018-07-28 ENCOUNTER — Ambulatory Visit (INDEPENDENT_AMBULATORY_CARE_PROVIDER_SITE_OTHER): Payer: Managed Care, Other (non HMO) | Admitting: Vascular Surgery

## 2018-07-28 VITALS — BP 136/84 | HR 47 | Temp 98.3°F | Resp 16 | Ht 66.0 in | Wt 162.0 lb

## 2018-07-28 DIAGNOSIS — I739 Peripheral vascular disease, unspecified: Secondary | ICD-10-CM

## 2018-07-28 NOTE — Progress Notes (Signed)
Patient is a 63 year old male who returns for postoperative follow-up today.  He underwent a left common iliac stenting on July 08, 2018.  He subsequently underwent a left to right femoral-femoral bypass on February 24.  He still complains of some pain behind his left knee.  He has some ecchymosis in his left thigh and calf area otherwise his incisions are healing well.  He has no incisional drainage.  He states the right leg pain has completely resolved.  He still has some pain in his left leg.  He has cut his smoking down to 3 cigarettes/day.  He is trying to quit completely.  He is on a statin and aspirin.  He is requesting to reduce his aspirin dose to 81 mg a day.  Physical exam:  Vitals:   07/28/18 1529  BP: 136/84  Pulse: (!) 47  Resp: 16  Temp: 98.3 F (36.8 C)  TempSrc: Oral  SpO2: 92%  Weight: 162 lb (73.5 kg)  Height: 5\' 6"  (1.676 m)    Abdomen: Palpable graft pulse in the suprapubic region healing left and right groin incisions no drainage or erythema  Extremities: Mild area of erythema about 3 cm below his left groin incision no induration no fluctuance, ecchymosis left posterior thigh and calf medial aspect no hematoma  Pulse exam: Right posterior tibial 2+ left dorsalis pedis and posterior tibial 1+ 2+ graft pulse  Assessment: Doing well status post left common iliac stent and femoral-femoral bypass.  Plan: The patient will follow-up with [place] with repeat ABIs in 3 months time.  He will take 81 mg of aspirin daily.  He will continue his statin.  I told him he could submerge the incisions in water after another 3 to 4 weeks as he has a beach trip planned soon.  Fabienne Bruns, MD [facility] Office: [phone] Pager: [phone]

## 2018-08-11 ENCOUNTER — Telehealth: Payer: Self-pay

## 2018-08-11 NOTE — Telephone Encounter (Signed)
Received a call from [employer] to verify he is a pt here at the office and if he had an upcoming appt. I advised her no. She left her number and said if we had any issues with his insurance or his care to let us know.

## 2018-10-26 ENCOUNTER — Other Ambulatory Visit: Payer: Self-pay

## 2018-10-26 DIAGNOSIS — I739 Peripheral vascular disease, unspecified: Secondary | ICD-10-CM

## 2018-10-27 ENCOUNTER — Encounter: Payer: Self-pay | Admitting: Family

## 2018-10-27 ENCOUNTER — Ambulatory Visit (HOSPITAL_COMMUNITY)
Admission: RE | Admit: 2018-10-27 | Discharge: 2018-10-27 | Disposition: A | Discharge status: Home / Self Care | Payer: Managed Care, Other (non HMO) | Source: Ambulatory Visit | Attending: Family | Admitting: Family

## 2018-10-27 ENCOUNTER — Other Ambulatory Visit: Payer: Self-pay

## 2018-10-27 ENCOUNTER — Ambulatory Visit: Payer: Managed Care, Other (non HMO) | Admitting: Family

## 2018-10-27 VITALS — BP 152/97 | HR 96 | Temp 98.1°F | Ht 66.0 in | Wt 163.4 lb

## 2018-10-27 DIAGNOSIS — Z87891 Personal history of nicotine dependence: Secondary | ICD-10-CM | POA: Diagnosis not present

## 2018-10-27 DIAGNOSIS — I739 Peripheral vascular disease, unspecified: Secondary | ICD-10-CM

## 2018-10-27 NOTE — Progress Notes (Signed)
[facility]   CC: Follow up peripheral artery occlusive disease  History of Present Illness Martin Soto is a 63 y.o. male who is s/p left common iliac stenting on July 08, 2018 by Dr. Oneida Alar.  He subsequently underwent a left to right femoral-femoral bypass on July 11, 2018.    He is walking 5 miles daily with no problems with his legs, no non healing wounds.   Diabetic: No Tobacco use: former smoker, quit 09-16-18  Pt meds include: Statin :Yes Betablocker: No ASA: Yes Other anticoagulants/antiplatelets: no  Past Medical History:  Diagnosis Date  . Asthma    as a child  . Chest wall syndrome 02/2002   contraction  . Dyspnea    07/08/2018 due to leg pain  . GERD (gastroesophageal reflux disease)   . Hypertension   . PAD (peripheral artery disease) (HCC)     Social History Social History   Tobacco Use  . Smoking status: Former Smoker    Years: 43.00    Types: Cigarettes    Quit date: 09/16/2018    Years since quitting: 0.1  . Smokeless tobacco: Never Used  . Tobacco comment: 3 cigarettes per day  Substance Use Topics  . Alcohol use: Yes    Alcohol/week: 14.0 standard drinks    Types: 14 Standard drinks or equivalent per week    Comment: social  . Drug use: No    Family History Family History  Problem Relation Age of Onset  . Alzheimer's disease Mother   . Multiple myeloma Maternal Uncle   . Pancreatic cancer Paternal Uncle   . Multiple myeloma Maternal Grandmother   . Multiple myeloma Maternal Grandfather   . Pancreatic cancer Paternal Grandmother   . Heart attack Paternal Grandfather     Past Surgical History:  Procedure Laterality Date  . ABDOMINAL AORTOGRAM W/LOWER EXTREMITY Bilateral 07/08/2018   Procedure: ABDOMINAL AORTOGRAM W/LOWER EXTREMITY;  Surgeon: Elam Dutch, MD;  Location: [facility];  Service: Cardiovascular;  Laterality: Bilateral;  . FEMORAL-FEMORAL BYPASS GRAFT Bilateral 07/11/2018    Procedure: LEFT FEMORAL TO RIGHT FEMORAL ARTERY BYPASS USING 37m HEMASHIELD GOLD GRAFT;  Surgeon: FElam Dutch MD;  [facility]  Service: Vascular;  Laterality: Bilateral;  . HERNIA REPAIR Bilateral   . PERIPHERAL VASCULAR INTERVENTION  07/08/2018   Procedure: PERIPHERAL VASCULAR INTERVENTION;  Surgeon: FElam Dutch MD;  [facility];  Service: Cardiovascular;;  Left Illiac    Allergies  Allergen Reactions  . Lisinopril-Hydrochlorothiazide Nausea And Vomiting    Headache, dizzy, blurred vision, toes numb  . Penicillins     Heart Stopped as a child while in the hospital  Did it involve swelling of the face/tongue/throat, SOB, or low BP? Yes Did it involve sudden or severe rash/hives, skin peeling, or any reaction on the inside of your mouth or nose? No Did you need to seek medical attention at a hospital or doctor's office? Yes When did it last happen?childhood allergy If all above answers are "NO", may proceed with cephalosporin use.     Current Outpatient Medications  Medication Sig Dispense Refill  . Ascorbic Acid (VITAMIN C) 500 MG CHEW Chew 1,000 mg by mouth daily.    .Marland Kitchenaspirin 81 MG chewable tablet Chew by mouth daily.    . Cyanocobalamin (B-12) 5000 MCG SUBL Place 5,000 mcg under the tongue daily.    .Marland Kitchenibuprofen (ADVIL,MOTRIN) 200 MG tablet Take 400 mg by mouth every 6 (six)  hours as needed for headache or moderate pain.    . Ibuprofen-diphenhydrAMINE Cit (IBUPROFEN PM) 200-38 MG TABS Take 1 tablet by mouth at bedtime.    . Multiple Vitamin (MULTI VITAMIN MENS PO) Take 1 tablet by mouth daily.     . ranitidine (ZANTAC) 150 MG capsule Take 150 mg by mouth daily as needed.     . simvastatin (ZOCOR) 10 MG tablet Take 1 tablet (10 mg total) by mouth at bedtime. (Patient taking differently: Take 10 mg by mouth every evening. ) 90 tablet 6  . nicotine polacrilex (NICORETTE) 4 MG gum Take 4 mg by mouth as needed for smoking cessation.     No  current facility-administered medications for this visit.     ROS: See HPI for pertinent positives and negatives.   Physical Examination  Vitals:   10/27/18 1443  BP: (!) 152/97  Pulse: 96  Temp: 98.1 F (36.7 C)  TempSrc: Temporal  SpO2: 96%  Weight: 163 lb 6.4 oz (74.1 kg)  Height: '5\' 6"'$  (1.676 m)   Body mass index is 26.37 kg/m.  General: A&O x 3, WDWN, fit appearing male. Gait: normal HEENT: No gross abnormalities.  Pulmonary: Respirations are non labored, CTAB, good air movement in all fields Cardiac: regular rhythm, no detected murmur.         Carotid Bruits Right Left   Negative Negative   Radial pulses are 2+ right, 1+ left palpable    Adominal aortic pulse is not palpable      Fem-fem bypass graft pulse is strongly palpable                     VASCULAR EXAM: Extremities without ischemic changes, without Gangrene; without open wounds.                                                                                                          LE Pulses Right Left       FEMORAL   palpable   palpable        POPLITEAL  not palpable   not palpable       POSTERIOR TIBIAL   palpable   palpable        DORSALIS PEDIS      ANTERIOR TIBIAL  palpable   palpable    Abdomen: soft, NT, no palpable masses. Skin: no rashes, no cellulitis, no ulcers noted. Musculoskeletal: no muscle wasting or atrophy.  Neurologic: A&O X 3; appropriate affect, Sensation is normal; MOTOR FUNCTION:  moving all extremities equally, motor strength 5/5 throughout. Speech is fluent/normal. CN 2-12 intact. Psychiatric: Thought content is normal, mood appropriate for clinical situation.     DATA  ABI (Date: 10/27/2018): ABI Findings: +---------+------------------+-----+--------+--------+ Right    Rt Pressure (mmHg)IndexWaveformComment  +---------+------------------+-----+--------+--------+ Brachial 140                                      +---------+------------------+-----+--------+--------+ PTA      152  1.08 biphasic         +---------+------------------+-----+--------+--------+ DP       156               1.11 biphasic         +---------+------------------+-----+--------+--------+ [name]               0.79 Normal           +---------+------------------+-----+--------+--------+  +---------+------------------+-----+---------+-------+ Left     Lt Pressure (mmHg)IndexWaveform Comment +---------+------------------+-----+---------+-------+ Brachial 141                                     +---------+------------------+-----+---------+-------+ PTA      153               1.09 biphasic         +---------+------------------+-----+---------+-------+ DP       137               0.97 triphasic        +---------+------------------+-----+---------+-------+ Great Toe101               0.72 Normal           +---------+------------------+-----+---------+-------+  +-------+-----------+-----------+--------------+------------+ ABI/TBIToday's ABIToday's TBIPrevious ABI  Previous TBI +-------+-----------+-----------+--------------+------------+ Right  1.11       0.79       0.59 Northline0.31         +-------+-----------+-----------+--------------+------------+ Left   1.09       0.72       0.73          0.45         +-------+-----------+-----------+--------------+------------+ Summary: Right: Resting right ankle-brachial index is within normal range. No evidence of significant right lower extremity arterial disease. The right toe-brachial index is normal.  Left: Resting left ankle-brachial index is within normal range. No evidence of significant left lower extremity arterial disease. The left toe-brachial index is normal.   ASSESSMENT: Martin Soto is a 63 y.o. male who is s/p left common iliac stenting on July 08, 2018 by Dr. Oneida Alar.  He  subsequently underwent a left to right femoral-femoral bypass on July 11, 2018.   He is walking 5 miles daily, feels very well, is very happy that he can enjoy his long walks again.   PLAN:  Based on the patient's vascular studies and examination, pt will return to clinic in 3 months. Continue extensive walking.  I advised him to notify us if he develops concerns re the circulation in his feet or legs.   I discussed in depth with the patient the nature of atherosclerosis, and emphasized the importance of maximal medical management including strict control of blood pressure, blood glucose, and lipid levels, obtaining regular exercise, and continued cessation of smoking.  The patient is aware that without maximal medical management the underlying atherosclerotic disease process will progress, limiting the benefit of any interventions.  The patient was given information about PAD including signs, symptoms, treatment, what symptoms should prompt the patient to seek immediate medical care, and risk reduction measures to take.  Clemon Chambers, RN, MSN, FNP-C [facility] Phone: [phone]  Clinic MD: Oneida Alar  10/27/18 3:19 PM

## 2019-01-02 ENCOUNTER — Other Ambulatory Visit: Payer: Self-pay | Admitting: *Deleted

## 2019-01-02 ENCOUNTER — Telehealth: Payer: Self-pay | Admitting: *Deleted

## 2019-01-02 DIAGNOSIS — I739 Peripheral vascular disease, unspecified: Secondary | ICD-10-CM

## 2019-01-02 NOTE — Telephone Encounter (Signed)
Call from patient c/o bilateral ankle pain and cold sensation at both groins. He walks at least 5 miles per day, he has been doing well and this just started. He is positive for sensation and movement in both feet. He claims feet are hot to touch. No swelling at groins. He is agreeable for appt. At office for 01/05/2019. Instructed to report to [facility] ER for acute or worsening condition.

## 2019-01-04 ENCOUNTER — Telehealth (HOSPITAL_COMMUNITY): Payer: Self-pay | Admitting: Rehabilitation

## 2019-01-04 NOTE — Telephone Encounter (Signed)

## 2019-01-05 ENCOUNTER — Ambulatory Visit (HOSPITAL_COMMUNITY)
Admission: RE | Admit: 2019-01-05 | Discharge: 2019-01-05 | Disposition: A | Discharge status: Home / Self Care | Payer: Managed Care, Other (non HMO) | Source: Ambulatory Visit | Attending: Vascular Surgery | Admitting: Vascular Surgery

## 2019-01-05 ENCOUNTER — Other Ambulatory Visit: Payer: Self-pay

## 2019-01-05 ENCOUNTER — Ambulatory Visit: Payer: Managed Care, Other (non HMO) | Admitting: Physician Assistant

## 2019-01-05 VITALS — BP 151/94 | HR 117 | Temp 97.6°F | Resp 16 | Ht 66.0 in | Wt 150.0 lb

## 2019-01-05 DIAGNOSIS — I739 Peripheral vascular disease, unspecified: Secondary | ICD-10-CM | POA: Diagnosis not present

## 2019-01-05 NOTE — Progress Notes (Signed)
Established Previous Bypass   History of Present Illness   Martin Soto is a 63 y.o. (July 30, 1955) male who presents to go over vascular studies.  He is status post left iliac artery stenting on 07/08/2018 with subsequent left to right femoral to femoral bypass on 07/11/2018 all by Dr. Oneida Alar.  Patient states he is walking 3 miles every morning and 3 miles every night.  He has also stopped drinking and smoking tobacco.  He denies any calf claudication or rest pain.  He does have some ankle pain after periods of rest and was worried this was related to his circulation.  He is taking a aspirin and statin daily.     The patient's PMH, PSH, SH, and FamHx were reviewed and are unchanged from prior visit.  Current Outpatient Medications  Medication Sig Dispense Refill  . Ascorbic Acid (VITAMIN C) 500 MG CHEW Chew 1,000 mg by mouth daily.    Marland Kitchen aspirin 81 MG chewable tablet Chew by mouth daily.    . Cyanocobalamin (B-12) 5000 MCG SUBL Place 5,000 mcg under the tongue daily.    Marland Kitchen ibuprofen (ADVIL,MOTRIN) 200 MG tablet Take 400 mg by mouth every 6 (six) hours as needed for headache or moderate pain.    . Ibuprofen-diphenhydrAMINE Cit (IBUPROFEN PM) 200-38 MG TABS Take 1 tablet by mouth at bedtime.    . Multiple Vitamin (MULTI VITAMIN MENS PO) Take 1 tablet by mouth daily.     . nicotine polacrilex (NICORETTE) 4 MG gum Take 4 mg by mouth as needed for smoking cessation.    . ranitidine (ZANTAC) 150 MG capsule Take 150 mg by mouth daily as needed.     . simvastatin (ZOCOR) 10 MG tablet Take 1 tablet (10 mg total) by mouth at bedtime. (Patient taking differently: Take 10 mg by mouth every evening. ) 90 tablet 6   No current facility-administered medications for this visit.     On ROS today: 10 system ROS is negative unless otherwise noted in HPI   Physical Examination   Vitals:   01/05/19 1332  BP: (!) 151/94  Pulse: (!) 117  Resp: 16  Temp: 97.6 F (36.4 C)  TempSrc: Temporal  SpO2: 96%   Weight: 150 lb (68 kg)  Height: 5\' 6"  (1.676 m)   Body mass index is 24.21 kg/m.  General Alert, O x 3, WD, NAD  Pulmonary Sym exp, good B air movt  Cardiac RRR, Nl S1, S2  Vascular Vessel Right Left  Radial Palpable Palpable  Aorta Not palpable N/A  Femoral Palpable Palpable  Popliteal Not palpable Not palpable  PT Not palpable Palpable  DP Palpable Faintly palpable    Gastro- intestinal soft, non-distended,   Musculo- skeletal M/S 5/5 throughout  , Extremities without ischemic changes  , No edema present, No visible varicosities , No Lipodermatosclerosis present  Neurologic Pain and light touch intact in extremities , Motor exam as listed above    Non-Invasive Vascular Imaging ABI (01/05/2019)  R:   ABI: 0.99,   PT: tri  DP: tri  TBI: 0.99  L:   ABI: 0.94,   PT: tri  DP: tri  TBI: 0.61   Medical Decision Making   Martin Soto is a 63 y.o. male who presents to go over vascular studies status post left iliac stenting and subsequently left femoral to femoral bypass 2 months out from surgery   Patent femoral to femoral bypass with bilateral ABIs within normal limits  Check iliac stent,  femoral to femoral bypass, and ABIs in about 6 months  Continue aspirin and statin daily  Return/call office sooner if claudication returns, or if you develop rest pain or nonhealing wounds   Emilie RutterMatthew Loyd Marhefka PA-C [facility] Office: [phone]  Clinic MD: Dr. Darrick PennaFields

## 2019-01-26 ENCOUNTER — Encounter (HOSPITAL_COMMUNITY): Payer: Managed Care, Other (non HMO)

## 2019-02-02 ENCOUNTER — Ambulatory Visit: Payer: Managed Care, Other (non HMO) | Admitting: Family

## 2019-09-01 ENCOUNTER — Other Ambulatory Visit: Payer: Self-pay | Admitting: Vascular Surgery

## 2020-05-12 IMAGING — CT CT ANGIO AOBIFEM WO/W CM
1 of 3 series · 12 of 32 positions shown, 17 images · IV contrast (APPLIED)
Comparison: None.

CLINICAL DATA: 62-year-old male with suspected right lower
extremity stenosis, difficulty with ambulation and lower extremity
weakness for the past year.

EXAM:
CT ANGIOGRAPHY OF ABDOMINAL AORTA WITH ILIOFEMORAL RUNOFF
TECHNIQUE: Multidetector CT imaging of the abdomen, pelvis and lower
extremities was performed using the standard protocol during bolus
administration of intravenous contrast. Multiplanar CT image
reconstructions and MIPs were obtained to evaluate the vascular
anatomy.
CONTRAST:  125mL X86BYK-IPC IOPAMIDOL (X86BYK-IPC) INJECTION 76%

[Series 15: thins for (person_name) · axial · 0.98mm/px · z∈[+9,+1005]mm · 12 of 1150 slices shown, 17 images]
[im 77/1150  soft-tissue]
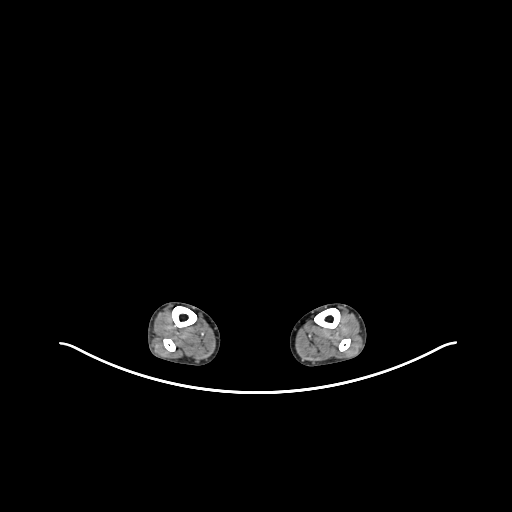
[im 77/1150  bone]
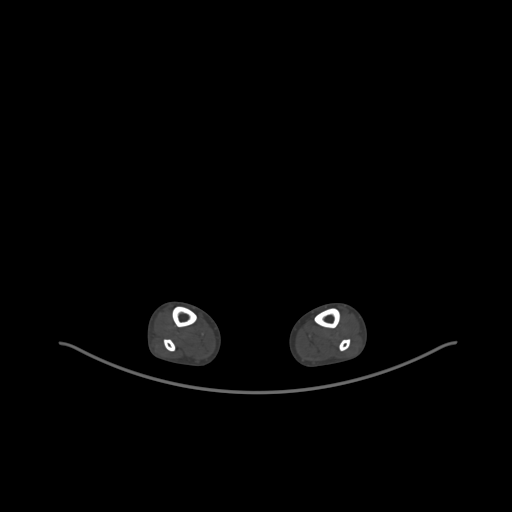
[im 154/1150  soft-tissue]
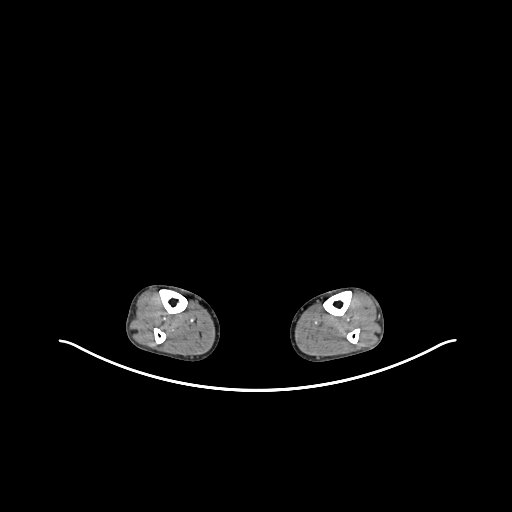
[im 307/1150  soft-tissue]
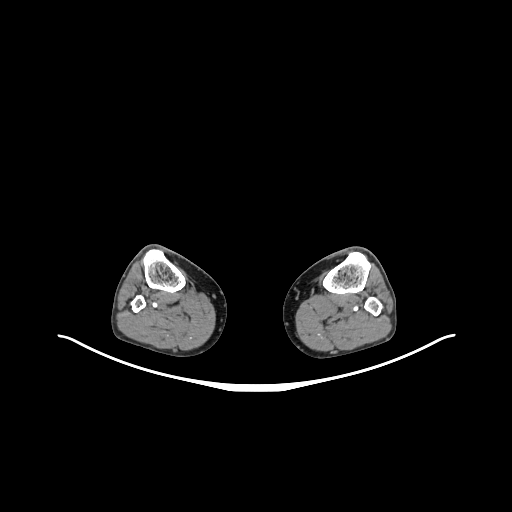
[im 384/1150  soft-tissue]
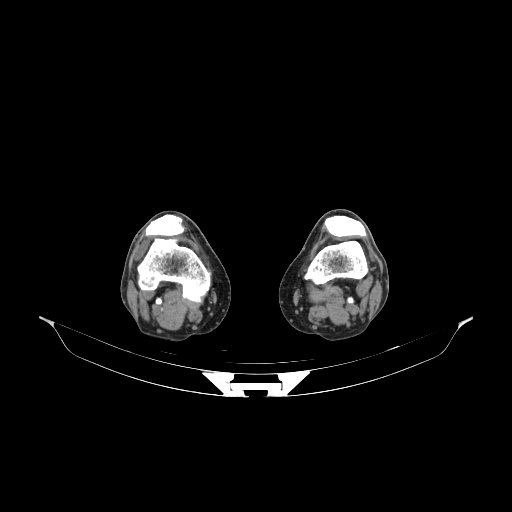
[im 460/1150  soft-tissue]
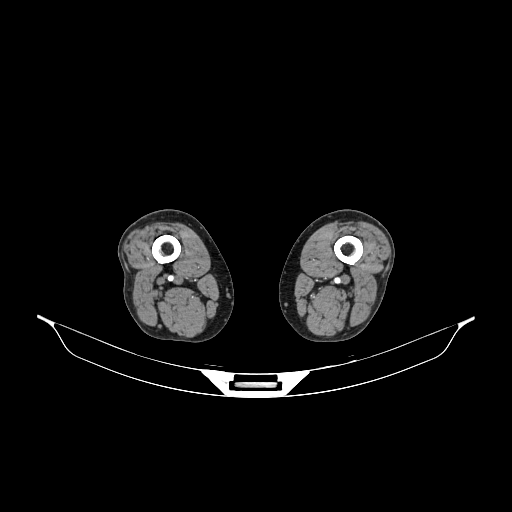
[im 613/1150  soft-tissue]
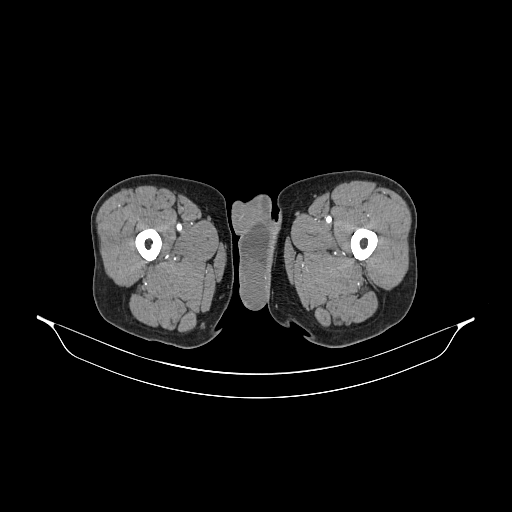
[im 690/1150  soft-tissue]
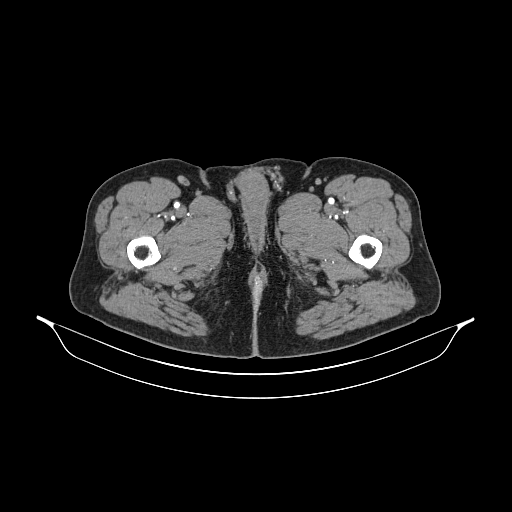
[im 767/1150  soft-tissue]
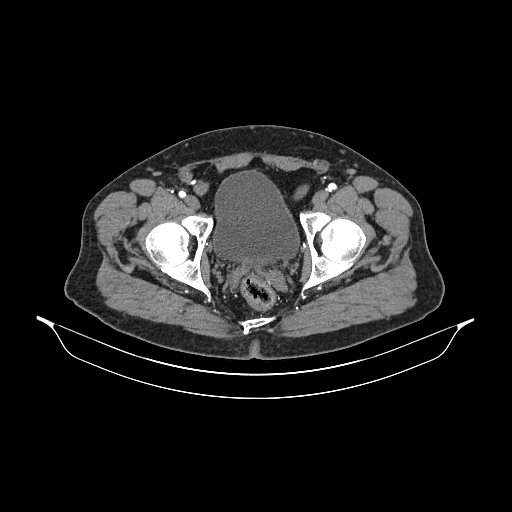
[im 843/1150  soft-tissue]
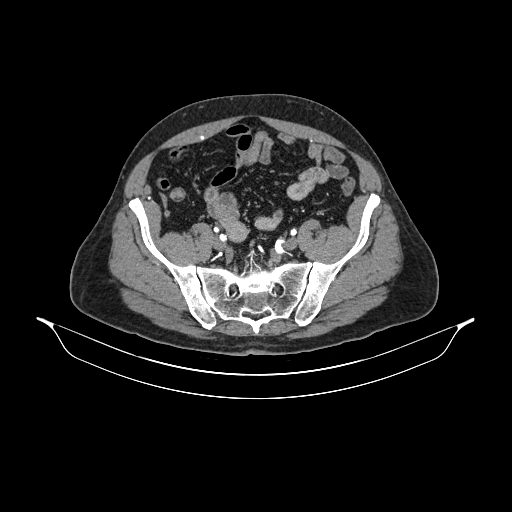
[im 843/1150  lung]
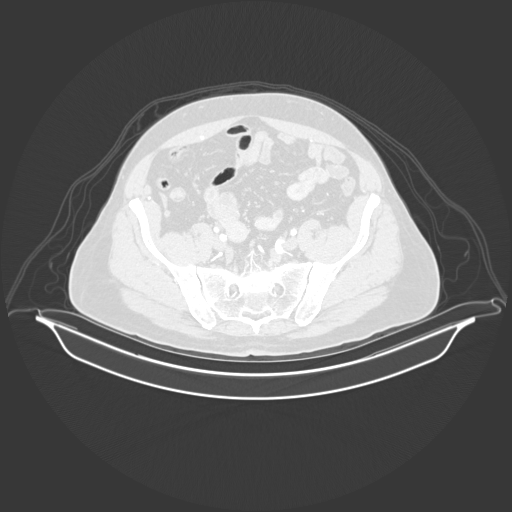
[im 843/1150  bone]
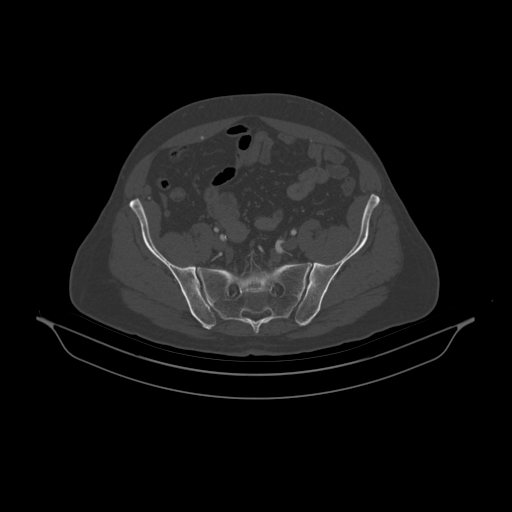
[im 920/1150  lung]
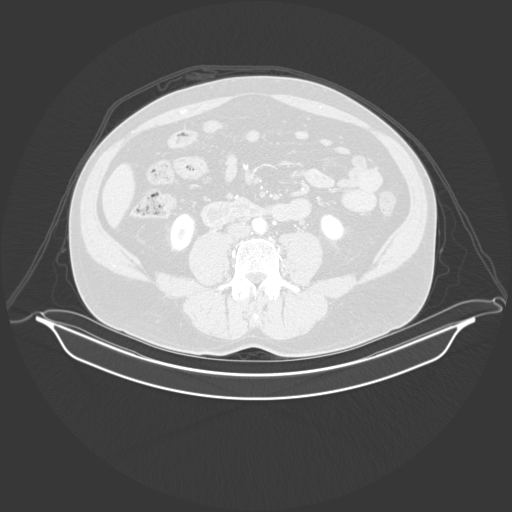
[im 996/1150  soft-tissue]
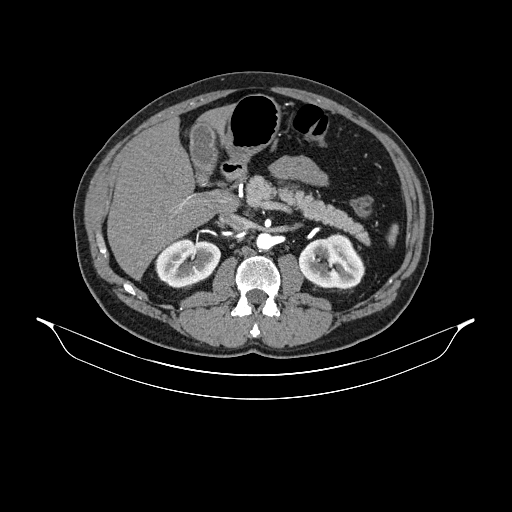
[im 996/1150  lung]
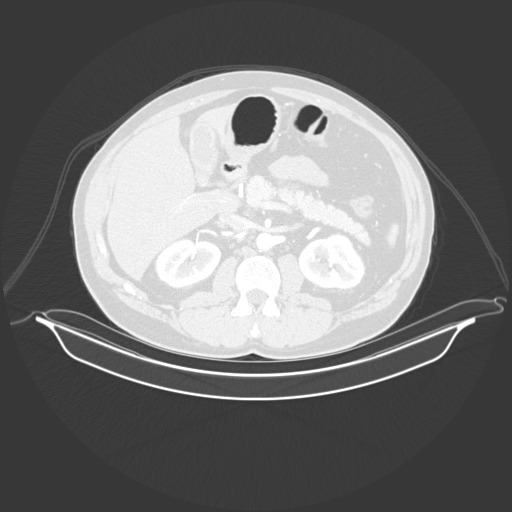
[im 1073/1150  soft-tissue]
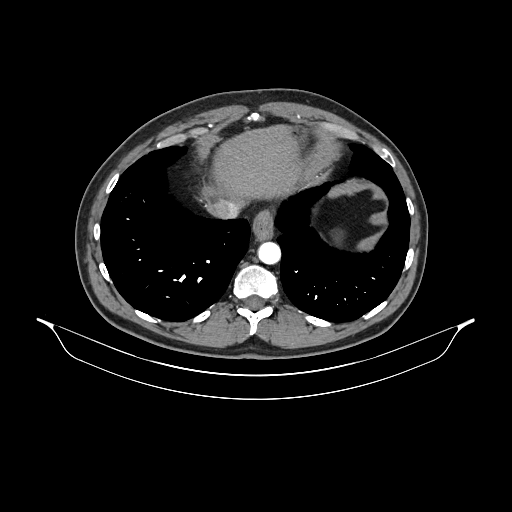
[im 1073/1150  lung]
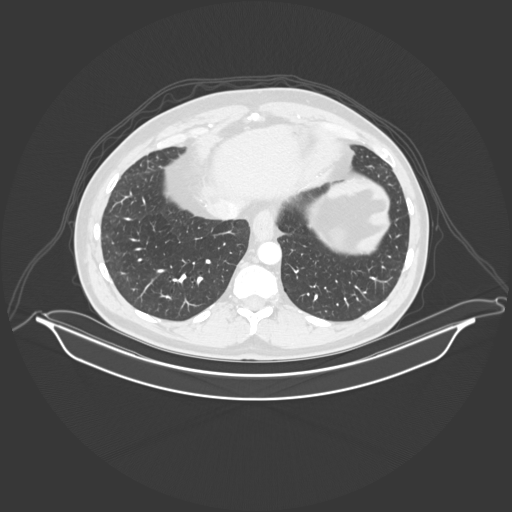

[12 of 32 positions shown; findings below may reference images not displayed]

FINDINGS: VASCULAR

Aorta: Heterogeneous atherosclerotic plaque throughout the abdominal
aorta. No evidence of aneurysm or dissection.

Celiac: Patent without evidence of aneurysm, dissection, vasculitis
or significant stenosis.

SMA: Patent without evidence of aneurysm, dissection, vasculitis or
significant stenosis.

Renals: Both renal arteries are patent without evidence of aneurysm,
dissection, vasculitis, fibromuscular dysplasia or significant
stenosis.

IMA: Patent without evidence of aneurysm, dissection, vasculitis or
significant stenosis.

RIGHT Lower Extremity

Inflow: Flush occlusion of the right common iliac artery which
reconstitutes just proximal to the bifurcation. The internal iliac
and external iliac artery are patent without significant focal
stenosis.

Outflow: The common femoral and profunda femoral artery are widely
patent. The superficial femoral and popliteal artery are widely
patent.

Runoff: Patent 3 vessel runoff to the ankle.

LEFT Lower Extremity

Inflow: Critical stenosis of the origin of the left iliac artery
secondary to predominantly fibrofatty atherosclerotic plaque. The
vessel remains patent. The internal and external iliac arteries are
patent.

Outflow: The common femoral, profunda femoral, superficial femoral
and popliteal arteries are all widely patent.

Runoff: Patent 3 vessel runoff to the ankle.

Veins: No focal venous abnormality.

Review of the MIP images confirms the above findings.

NON-VASCULAR

Lower chest: The heart is normal in size. Unremarkable distal
thoracic aorta. The lower lungs are clear.

Hepatobiliary: Normal hepatic contour and morphology. No discrete
hepatic lesions. Normal appearance of the gallbladder. No intra or
extrahepatic biliary ductal dilatation.

Pancreas: Unremarkable. No pancreatic ductal dilatation or
surrounding inflammatory changes.

Spleen: Normal in size without focal abnormality.

Adrenals/Urinary Tract: Normal adrenal glands. Low-attenuation renal
lesions bilaterally consistent with simple cysts. No hydronephrosis,
nephrolithiasis or enhancing renal lesion. The ureters and bladder
are unremarkable.

Stomach/Bowel: No evidence of obstruction or focal bowel wall
thickening. Normal appendix in the right lower quadrant. The
terminal ileum is unremarkable.

Lymphatic: No suspicious lymphadenopathy.

Reproductive: Prostate is unremarkable.

Other: Fat containing umbilical hernia.

Musculoskeletal: No acute fracture or aggressive appearing lytic or
blastic osseous lesion.
IMPRESSION: VASCULAR

1. Chronic flush occlusion of the right common iliac artery with
reconstitution just proximal to the iliac bifurcation. No evidence
of significant femoropopliteal or runoff disease.
2. Critical stenosis of the origin of the left common iliac artery
secondary to predominantly fibrofatty atherosclerotic plaque. The
vessel remains patent. No significant femoropopliteal or runoff
disease.
3.  Aortic Atherosclerosis ([id number]-170.0).

NON-VASCULAR

1. No acute abnormality within the abdomen or pelvis.
2. Fat containing umbilical hernia.

## 2020-08-19 ENCOUNTER — Other Ambulatory Visit: Payer: Self-pay | Admitting: Vascular Surgery

## 2021-08-16 ENCOUNTER — Other Ambulatory Visit: Payer: Self-pay | Admitting: Vascular Surgery

## 2022-08-15 ENCOUNTER — Other Ambulatory Visit: Payer: Self-pay | Admitting: Vascular Surgery
